# Patient Record
Sex: Male | Born: 1985 | Race: White | Hispanic: No | Marital: Single | Smoking: Never smoker
Health system: Southern US, Community
[De-identification: ages and names within clinical notes are randomized; demographics above are authoritative.]

## PROBLEM LIST (undated history)

## (undated) DIAGNOSIS — M1990 Unspecified osteoarthritis, unspecified site: Secondary | ICD-10-CM

## (undated) DIAGNOSIS — Z973 Presence of spectacles and contact lenses: Secondary | ICD-10-CM

## (undated) DIAGNOSIS — E878 Other disorders of electrolyte and fluid balance, not elsewhere classified: Secondary | ICD-10-CM

## (undated) DIAGNOSIS — R519 Headache, unspecified: Secondary | ICD-10-CM

## (undated) DIAGNOSIS — T4145XA Adverse effect of unspecified anesthetic, initial encounter: Secondary | ICD-10-CM

## (undated) DIAGNOSIS — K219 Gastro-esophageal reflux disease without esophagitis: Secondary | ICD-10-CM

## (undated) DIAGNOSIS — T8859XA Other complications of anesthesia, initial encounter: Secondary | ICD-10-CM

## (undated) DIAGNOSIS — R51 Headache: Secondary | ICD-10-CM

## (undated) DIAGNOSIS — E119 Type 2 diabetes mellitus without complications: Secondary | ICD-10-CM

## (undated) HISTORY — PX: TONSILLECTOMY: SUR1361

## (undated) HISTORY — PX: LIMB SALVAGE PROCEDURE: SHX1968

## (undated) HISTORY — PX: SHOULDER ARTHROSCOPY: SHX128

## (undated) HISTORY — PX: OTHER SURGICAL HISTORY: SHX169

## (undated) HISTORY — PX: WRIST ARTHROSCOPY: SUR100

## (undated) HISTORY — PX: KNEE ARTHROPLASTY: SHX992

---
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
## 2006-12-02 ENCOUNTER — Encounter: Admission: RE | Admit: 2006-12-02 | Discharge: 2006-12-02 | Payer: Self-pay | Admitting: Family Medicine

## 2006-12-09 ENCOUNTER — Encounter: Admission: RE | Admit: 2006-12-09 | Discharge: 2006-12-09 | Payer: Self-pay | Admitting: Emergency Medicine

## 2008-11-16 ENCOUNTER — Ambulatory Visit (HOSPITAL_BASED_OUTPATIENT_CLINIC_OR_DEPARTMENT_OTHER): Admission: RE | Admit: 2008-11-16 | Discharge: 2008-11-16 | Payer: Self-pay | Admitting: Orthopedic Surgery

## 2010-11-10 ENCOUNTER — Encounter: Payer: Self-pay | Admitting: Family Medicine

## 2010-11-17 ENCOUNTER — Ambulatory Visit (INDEPENDENT_AMBULATORY_CARE_PROVIDER_SITE_OTHER): Payer: Managed Care, Other (non HMO) | Admitting: Family Medicine

## 2010-11-17 ENCOUNTER — Encounter: Payer: Self-pay | Admitting: Family Medicine

## 2010-11-17 VITALS — BP 136/81 | Ht 73.0 in | Wt 311.0 lb

## 2010-11-17 DIAGNOSIS — M25579 Pain in unspecified ankle and joints of unspecified foot: Secondary | ICD-10-CM

## 2010-11-17 DIAGNOSIS — M2140 Flat foot [pes planus] (acquired), unspecified foot: Secondary | ICD-10-CM

## 2010-11-17 DIAGNOSIS — E669 Obesity, unspecified: Secondary | ICD-10-CM

## 2010-11-17 NOTE — Progress Notes (Signed)
  Subjective:    Patient ID: Rodney Clarke, male    DOB: Sep 02, 1985, 25 y.o.   MRN: [id number]  HPI  Bilateral R>L ankle pain for a month. 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 He does a lot of climbing on ladders  Which seems to make it worse.   Pain is posterior ankle near achilles--aching. ? Occasional swelling. No pop or pain in calf. Review of Systems Pertinent review of systems: negative for fever or unusual weight change.     Objective:   Physical Exam  generally WD overweight NAD EET: pes planus B. Loss of transverse arch B/ can only do three heel raises with both feet and narely one on single leg stance B. TTP  Area either side of the achilles B. Achilles is intact and without defect. Calf soft B. Normal calf squeeze test.  GAIT: out toeing. Strikes lateral heel/ Normal stride length.  ULTRASOUND: B achiiles intact. Some calcifications at insertion R>L. No increased vascularity on  Either side. No retrocalcaneal bursa noted. Calcaneus B without cortical defect. B Gastrocnemius and soleus m intact.      Assessment & Plan:  1.  B ankle pain--I think he became deconditioned to standing and climbing while off for his shoulder. We placed him in temp insoles w scaphoid pad B--if this improves his issues, he may rtc for custom molded orthotics. Will also start him on Heel raises twice daily (B 10-20) and( single stance 8-12 TID). I demonstrated these in clinic and he was given handout. RTC 3-4 w 2. Briefly discussed weight loss as a way to improve ankle pain as well

## 2010-11-18 ENCOUNTER — Encounter: Payer: Self-pay | Admitting: Family Medicine

## 2010-11-18 DIAGNOSIS — M25579 Pain in unspecified ankle and joints of unspecified foot: Secondary | ICD-10-CM | POA: Insufficient documentation

## 2010-11-18 DIAGNOSIS — E669 Obesity, unspecified: Secondary | ICD-10-CM | POA: Insufficient documentation

## 2010-11-18 DIAGNOSIS — M2140 Flat foot [pes planus] (acquired), unspecified foot: Secondary | ICD-10-CM | POA: Insufficient documentation

## 2010-12-19 ENCOUNTER — Ambulatory Visit (INDEPENDENT_AMBULATORY_CARE_PROVIDER_SITE_OTHER): Payer: Managed Care, Other (non HMO) | Admitting: Family Medicine

## 2010-12-19 ENCOUNTER — Encounter: Payer: Self-pay | Admitting: Family Medicine

## 2010-12-19 VITALS — BP 126/84 | HR 83

## 2010-12-19 DIAGNOSIS — M2170 Unequal limb length (acquired), unspecified site: Secondary | ICD-10-CM

## 2010-12-19 DIAGNOSIS — M25579 Pain in unspecified ankle and joints of unspecified foot: Secondary | ICD-10-CM

## 2010-12-19 DIAGNOSIS — M24873 Other specific joint derangements of unspecified ankle, not elsewhere classified: Secondary | ICD-10-CM

## 2010-12-19 DIAGNOSIS — M25373 Other instability, unspecified ankle: Secondary | ICD-10-CM | POA: Insufficient documentation

## 2010-12-19 MED ORDER — MELOXICAM 15 MG PO TABS: 15.0000 mg | ORAL_TABLET | Freq: Every day | ORAL | Status: DC

## 2010-12-19 NOTE — Progress Notes (Signed)
PT referral info faxed to [facility] in [facility] per patient request.

## 2010-12-19 NOTE — Assessment & Plan Note (Addendum)
Failed home exercise program. Formal physical therapy. Meloxicam daily. Unable to decrease time on his feet at work due to financial reasons, even with a physicians excuse. Custom orthotics as above. He'll come back to see [name] in 4 weeks, if no better we will try a guided corticosteroid injection into the posterior tibial tendon sheath. If we do a guided injection of the posterior tibial tendon sheath, I would likely put him in an air sport brace and minimize his weight-bearing as much as possible for a week.

## 2010-12-19 NOTE — Patient Instructions (Addendum)
Great to meet Rodney Clarke, Part of your foot soreness is due to a tendinitis. Rebuilt her custom orthotics today. Like to put you into formal physical therapy program. I want you to take meloxicam every day for 2 weeks and then as needed afterwards.  Come back to see Rodney Clarke in 4 weeks, if no better we will try a guided corticosteroid injection around the posterior tibialis tendon.  Rodney Clarke. [name], M.D. [facility] [address] [place], [place] [address] [phone]   Posterior Tibial Tendon Tendinitis  Tendonitis is a condition that is characterized by inflammation of a tendon or the lining (sheath) that surrounds it. The inflammation is usually caused by damage to the tendon, such as a tendon tear (strain). Sprains are classified into three categories. Grade 1 sprains cause pain, but the tendon is not lengthened. Grade 2 sprains include a lengthened ligament due to the ligament being stretched or partially ruptured. With grade 2 sprains there is still function, although the function may be diminished. Grade 3 sprains are characterized by a complete tear of the tendon or muscle, and function is usually impaired. Posterior tibialis tendonitis is tendonitis of the posterior tibial tendon, which attaches muscles of the lower leg to the foot. The posterior tibial tendon is located in the back of the ankle and helps the body straighten (plantarflex) and rotate inward (medially rotate) the ankle. SYMPTOMS    Pain, tenderness, swelling, warmth, and/or redness over the back of the inner ankle at the posterior tibial tendon or the inner part of the mid-foot.     Pain that worsens with plantarflexion or medial rotation of the ankle.     A crackling sound (crepitation) when the tendon is moved or touched.  CAUSES   Posterior tibial tendonitis occurs when damage to the posterior tibial tendon starts an inflammatory response. Common mechanisms of injury include:  Degenerative  (occurs with aging) processes that weaken the tendon and make it more susceptible to injury.     Stress placed on the tendon from an increase in the intensity, frequency, or duration of training.     Direct trauma to the ankle.     Returning to activity before a previous ankle injury is allowed to heal.  RISK INCREASES WITH:  Activities that involve repetitive and/or stressful plantarflexion (jumping, kicking, or running up/down hills).     Poor strength and flexibility.     Flat feet.     Previous injury to the foot, ankle, or leg.  PREVENTION    Warm up and stretch properly before activity.     Allow for adequate recovery between workouts.     Maintain physical fitness:     Strength, flexibility, and endurance.     Cardiovascular fitness.     Learn and use proper technique. When possible, have a coach correct improper technique.     Complete rehabilitation from a previous foot, ankle, or leg injury.     If you have flat feet, wear arch supports (orthotics).  PROGNOSIS   If treated properly, then the symptoms of tendonitis usually resolve within 6 weeks. This period may be shorter for injuries caused by direct trauma. RELATED COMPLICATIONS    Prolonged healing time, if improperly treated or re-injured.     Recurrent symptoms that result in a chronic problem.     Partial or complete tendon tear (rupture) requiring surgery.  TREATMENT   Treatment initially involves the use of ice and medication to help reduce pain and inflammation.  The use of strengthening and stretching exercises may help reduce pain with activity. These exercises may be performed at home or with referral to a therapist. Often times, your caregiver will recommend immobilizing the ankle to allow the tendon to heal. If you have flat feet, the you may be advised to wear orthotic arch supports. If symptoms persist for greater than 6 months despite non-surgical (conservative) treatment, then surgery may be  recommended. MEDICATION    If pain medication is necessary, then nonsteroidal anti-inflammatory medications, such as aspirin and ibuprofen, or other minor pain relievers, such as acetaminophen, are often recommended.     Do not take pain medication for 7 days before surgery.     Prescription pain relievers may be given if deemed necessary by your caregiver. Use only as directed and only as much as you need.     Corticosteroid injections may be given by your caregiver. These injections should be reserved for the most serious cases, because they may only be given a certain number of times.  HEAT AND COLD  Cold treatment (icing) relieves pain and reduces inflammation. Cold treatment should be applied for 10 to 15 minutes every 2 to 3 hours for inflammation and pain and immediately after any activity that aggravates your symptoms. Use ice packs or massage the area with a piece of ice (ice massage).     Heat treatment may be used prior to performing the stretching and strengthening activities prescribed by your caregiver, physical therapist, or athletic trainer. Use a heat pack or soak the injury in warm water.  SEEK MEDICAL CARE IF:  Treatment seems to offer no benefit, or the condition worsens.    Any medications produce adverse side effects.

## 2010-12-19 NOTE — Progress Notes (Signed)
  Subjective:    Patient ID: Rodney Clarke, male    DOB: 1985-07-15, 25 y.o.   MRN: [id number]  HPI Bilateral Ankle pain: Right side much worse than the left, present for approximately 2 months now, was seen here 3-4 weeks ago, had a sports insoles prescribed with scaphoid pads. Noted his currently approximately 15% better. 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 He was also given a home exercise program consisted of toe raises, which she's been doing religiously and does note that she significantly stronger now. Naproxen does help particularly at night. Localizes the pain to the medial ankle posterior and inferior to the malleolus. It is significantly worse with the first few steps in the morning, however he does not localize any pain over the plantar aspect of the calcaneus.  Social history: No alcohol tobacco or drugs. Works as an Retail banker. Review of Systems    no fevers, chills, night sweats, weight loss. Objective:   Physical Exam General: Well-developed, overweight Caucasian male in no acute distress. Skin: Warm and dry. Neuro: Alert and oriented x3 extraocular muscles intact. Respiratory: Not using accessory muscles. MSK: Ankles unremarkable to inspection. Tender to palpation over the posterior tibialis tendon bilaterally, right worse than left. He also has some tenderness over the medial predominantly on the right side. Range of motion full to dorsiflexion, plantar flexion, inversion, eversion. Negative squeeze test, negative Kleiger test, negative talar tilt, negative anterior drawer test. Medial and lateral ligaments stable. Significant pes planus. Neurovascularly intact distally. Good hindfoot varus with toe raises viewed from behind. Negative too many toes sign. Right leg: 95 cm. Left leg: 92 cm. Ambulates with an antalgic gait. There is significant popping noted with every step coming from his right ankle. This  is moderately painful.  Patient was fitted for a : standard, cushioned, semi-rigid orthotic. The orthotic was heated and afterward the patient stood on the orthotic blank positioned on the orthotic stand. The patient was positioned in subtalar neutral position and 10 degrees of ankle dorsiflexion in a weight bearing stance. After completion of molding, a stable base was applied to the orthotic blank. The blank was ground to a stable position for weight bearing. Size: 11 Base: Blue EVA Posting: None Additional orthotic padding: We placed an additional blue EVA under the left orthotic to partially correct his leg length discrepancy. He was also given a felt lateral wedge on his right orthotic to correct oversupination. This improved a great deal of his popping and pain.  MSK ultrasound: Right ankle: Achilles tendon imaged in long, transverse, and logic views and unremarkable. There was a small nodule, however this was not tender to palpation and unlikely clinically significant. No retrocalcaneal bursa seen. Posterior tibial tendon noted to have significant fluid in the sheath. I did compared this to the posterior tibial tendon on the left side, and the left side did not display any significant fluid in the sheath. No increased Doppler flow seen. Images saved.    Assessment & Plan:

## 2010-12-23 ENCOUNTER — Ambulatory Visit: Payer: Managed Care, Other (non HMO) | Admitting: Physical Therapy

## 2011-01-16 ENCOUNTER — Ambulatory Visit (INDEPENDENT_AMBULATORY_CARE_PROVIDER_SITE_OTHER): Payer: Managed Care, Other (non HMO) | Admitting: Family Medicine

## 2011-01-16 ENCOUNTER — Encounter: Payer: Self-pay | Admitting: Family Medicine

## 2011-01-16 VITALS — BP 122/83 | HR 76

## 2011-01-16 DIAGNOSIS — M25579 Pain in unspecified ankle and joints of unspecified foot: Secondary | ICD-10-CM

## 2011-01-16 NOTE — Patient Instructions (Addendum)
We modified the orthotics with a small lateral post to take pressure off the outside of your foot.  You had developed a peroneal tendonitis due to overcorrection. Come back to see [name] in 1-2 weeks if no better. Ihor Austin. [name], M.D.

## 2011-01-16 NOTE — Progress Notes (Signed)
  Subjective:    Patient ID: Rodney Clarke, male    DOB: [birth year], 25 y.o.   MRN: [id number]  HPI  Bilateral Ankle pain: Here for followup of pain, had orthotics made as last visit. 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 Continues to do home exercise program. Switched to meloxicam at the last visit which is only of moderate benefit. Initially had pain predominantly over the posterior tibialis tendons, after orthotics he notes pain more predominantly over the lateral ankle. Still gets occasional clicking and catching.  Social history: No alcohol tobacco or drugs. Works as an Retail banker. Review of Systems    no fevers, chills, night sweats, weight loss. Objective:   Physical Exam  General: Well-developed, overweight Caucasian male in no acute distress. Skin: Warm and dry. Neuro: Alert and oriented x3 extraocular muscles intact. Respiratory: Not using accessory muscles. MSK: Ankles unremarkable to inspection. Tender to palpation over the peroneal tendons bilaterally, pain with resisted eversion. Range of motion full to dorsiflexion, plantar flexion, inversion, eversion. Negative squeeze test, negative Kleiger test, negative talar tilt, negative anterior drawer test. Medial and lateral ligaments stable. Significant pes planus. Neurovascularly intact distally. Good hindfoot varus with toe raises viewed from behind. Negative too many toes sign. Right leg: 95 cm. Left leg: 92 cm. No longer ambulates with an antalgic gait, popping he had before is significantly less.  We placed a lateral posting on both orthotics. He ambulated and these and they were comfortable.    Assessment & Plan:   1. bilateral foot pain: Overall improved, however I think the overcorrected his pes planus and created a mild peroneal tendinopathy. This was corrected with a slight lateral posting on both sides. We'll see him back in a period of one to 2  weeks, if still no better we will MRI his ankle.  As he did have significant halo sign at his first visit over the posterior tibialis tendon, and ultrasound guided tendon sheath injection is still an option.

## 2011-01-25 ENCOUNTER — Emergency Department (INDEPENDENT_AMBULATORY_CARE_PROVIDER_SITE_OTHER): Payer: Worker's Compensation

## 2011-01-25 ENCOUNTER — Emergency Department (HOSPITAL_BASED_OUTPATIENT_CLINIC_OR_DEPARTMENT_OTHER)
Admission: EM | Admit: 2011-01-25 | Discharge: 2011-01-25 | Disposition: A | Discharge status: Home / Self Care | Payer: Worker's Compensation | Attending: Emergency Medicine | Admitting: Emergency Medicine

## 2011-01-25 ENCOUNTER — Encounter (HOSPITAL_BASED_OUTPATIENT_CLINIC_OR_DEPARTMENT_OTHER): Payer: Self-pay | Admitting: *Deleted

## 2011-01-25 DIAGNOSIS — S60229A Contusion of unspecified hand, initial encounter: Secondary | ICD-10-CM

## 2011-01-25 DIAGNOSIS — IMO0002 Reserved for concepts with insufficient information to code with codable children: Secondary | ICD-10-CM | POA: Insufficient documentation

## 2011-01-25 DIAGNOSIS — X58XXXA Exposure to other specified factors, initial encounter: Secondary | ICD-10-CM

## 2011-01-25 DIAGNOSIS — Y9289 Other specified places as the place of occurrence of the external cause: Secondary | ICD-10-CM | POA: Insufficient documentation

## 2011-01-25 DIAGNOSIS — M79609 Pain in unspecified limb: Secondary | ICD-10-CM

## 2011-01-25 HISTORY — DX: Other disorders of electrolyte and fluid balance, not elsewhere classified: E87.8

## 2011-01-25 MED ORDER — HYDROCODONE-ACETAMINOPHEN 5-500 MG PO TABS: 1.0000 | ORAL_TABLET | Freq: Four times a day (QID) | ORAL | Status: AC | PRN

## 2011-01-25 NOTE — ED Provider Notes (Signed)
History     CSN: [id number] Arrival date & time: 01/25/2011  7:22 PM   First MD Initiated Contact with Patient 01/25/11 1923      Chief Complaint  Patient presents with  . 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 male presenting with hand injury. The history is provided by the patient. No language interpreter was used.  Hand Injury  The incident occurred 1 to 2 hours ago. The incident occurred at work. The injury mechanism was a direct blow. The pain is present in the right hand. The quality of the pain is described as throbbing. The pain is moderate. The pain has been constant since the incident. Pertinent negatives include no fever. He reports no foreign bodies present. The symptoms are aggravated by movement and palpation. He has tried nothing for the symptoms.    Past Medical History  Diagnosis Date  . Hypertension   . Hyperchloremia     Past Surgical History  Procedure Date  . Knee arthroplasty   . Shoulder arthroscopy   . Limb salvage procedure     History reviewed. No pertinent family history.  History  Substance Use Topics  . Smoking status: Never Smoker   . Smokeless tobacco: Never Used  . Alcohol Use: No      Review of Systems  Constitutional: Negative for fever.  All other systems reviewed and are negative.    Allergies  Phenergan  Home Medications   Current Outpatient Rx  Name Route Sig Dispense Refill  . IBUPROFEN 200 MG PO TABS Oral Take 800 mg by mouth every 6 (six) hours as needed. For pain     . MELOXICAM 15 MG PO TABS Oral Take 1 tablet (15 mg total) by mouth daily. 30 tablet 3    BP 139/83  Pulse 92  Temp 97.6 F (36.4 C)  Resp 16  Wt 311 lb (141.069 kg)   SpO2 97%  Physical Exam  Nursing note and vitals reviewed. Constitutional: He appears well-developed and well-nourished.  Cardiovascular: Normal rate and regular rhythm.   Pulmonary/Chest: Effort normal and breath sounds normal.  Musculoskeletal: Normal range of motion.       No gross deformity noted to the right hand:pt has full rom:pt tender along the fifth metacarpal  Neurological: He is alert.  Skin: Skin is dry.    ED Course  Procedures (including critical care time)  Labs Reviewed - No data to display Dg Hand Complete Right  01/25/2011  *RADIOLOGY REPORT*  Clinical Data: Right hand pain.  Fourth and fifth finger pain.  RIGHT HAND - COMPLETE 3+ VIEW  Comparison: None.  Findings: Anatomic alignment.  No fracture.  STT joint osteoarthritis is present.  Soft tissues appear within normal limits.  IMPRESSION: No acute abnormality.  Original Report Authenticated By: Andreas Newport, M.D.     1. Hand contusion       MDM  Pt ace wrapped for comfort:no acute bony abnormality noted       Teressa Lower, NP 01/25/11 2034  Medical screening examination/treatment/procedure(s) were performed by non-physician practitioner and as supervising physician I was immediately available for consultation/collaboration.  Sunnie Nielsen, MD 01/26/11 [phone]

## 2011-01-25 NOTE — ED Notes (Signed)
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

## 2011-01-28 ENCOUNTER — Ambulatory Visit (INDEPENDENT_AMBULATORY_CARE_PROVIDER_SITE_OTHER): Payer: Managed Care, Other (non HMO) | Admitting: Sports Medicine

## 2011-01-28 VITALS — BP 128/79

## 2011-01-28 DIAGNOSIS — M25579 Pain in unspecified ankle and joints of unspecified foot: Secondary | ICD-10-CM

## 2011-01-28 MED ORDER — AMITRIPTYLINE HCL 25 MG PO TABS: 25.0000 mg | ORAL_TABLET | Freq: Every day | ORAL | Status: DC

## 2011-01-28 NOTE — Patient Instructions (Signed)
Amitriptyline at bedtime. Modified orthotic. MRI ankle. Come back to see me after your MRI to go over results. Ihor Austin. [name], M.D.

## 2011-01-28 NOTE — Progress Notes (Signed)
  Subjective:    Patient ID: Rodney Clarke, male    DOB: 04-Feb-1986, 25 y.o.   MRN: [id number]  HPI The patient comes in for followup of bilateral foot pain right worse than left that has been present for 6 months. We initially diagnosed him with pes planus, posterior tibialis tendinitis which was confirmed by seeing a halo sign on the ultrasound. Custom orthotics were made which did help a little bit, however he did develop some peroneal tendinitis bilaterally. At that point we had considered that maybe we performed overcorrection of his pes planus and placed a lateral post. He did not notice a whole lot of difference, however returns today with increasing pain over his posterior tibialis tendon. He also gets significant popping that is audible over the lateral aspect of his right ankle.  Review of Systems    no fevers, chills, night sweats, weight loss. Objective:   Physical Exam General: Well-developed, overweight Caucasian male in no acute distress. Skin: Warm and dry. Neuro: Alert and oriented x3 extraocular muscles intact. Respiratory: Not using accessory muscles. MSK: Ankles unremarkable to inspection. Tender to palpation over the peroneal tendons bilaterally, pain with resisted eversion. Tenderness to palpation over the posterior tibialis tendons. Range of motion full to dorsiflexion, plantar flexion, inversion, eversion. Negative squeeze test, negative Kleiger test, negative talar tilt, negative anterior drawer test. Medial and lateral ligaments stable. Significant pes planus. Neurovascularly intact distally. Good hindfoot varus with toe raises viewed from behind. Negative too many toes sign.    Assessment & Plan:   1. ankle pain: Persistent pain despite above corrections, I do suspect that this is predominantly a posterior tibialis tendinitis. We removed the post from the anterior aspect of his orthotics. We'll also go ahead and get an MRI. If we see no tears, and just tendinopathy  of the posterior tibialis tendon we can do an ultrasound guided injection, we would likely place him in a boot for a week after the shot. We'll see him back after the MRI is done to go over results.

## 2011-01-30 ENCOUNTER — Ambulatory Visit
Admission: RE | Admit: 2011-01-30 | Discharge: 2011-01-30 | Disposition: A | Discharge status: Home / Self Care | Payer: Managed Care, Other (non HMO) | Source: Ambulatory Visit | Attending: Sports Medicine | Admitting: Sports Medicine

## 2011-01-30 ENCOUNTER — Other Ambulatory Visit: Payer: Self-pay | Admitting: Sports Medicine

## 2011-01-30 DIAGNOSIS — M25579 Pain in unspecified ankle and joints of unspecified foot: Secondary | ICD-10-CM

## 2011-01-30 DIAGNOSIS — Z139 Encounter for screening, unspecified: Secondary | ICD-10-CM

## 2011-03-09 ENCOUNTER — Other Ambulatory Visit: Payer: Self-pay | Admitting: Orthopedic Surgery

## 2011-03-09 DIAGNOSIS — R200 Anesthesia of skin: Secondary | ICD-10-CM

## 2011-03-18 ENCOUNTER — Ambulatory Visit
Admission: RE | Admit: 2011-03-18 | Discharge: 2011-03-18 | Disposition: A | Discharge status: Home / Self Care | Payer: Worker's Compensation | Source: Ambulatory Visit | Attending: Orthopedic Surgery | Admitting: Orthopedic Surgery

## 2011-03-18 ENCOUNTER — Ambulatory Visit
Admission: RE | Admit: 2011-03-18 | Discharge: 2011-03-18 | Disposition: A | Discharge status: Home / Self Care | Payer: Self-pay | Source: Ambulatory Visit | Attending: Orthopedic Surgery | Admitting: Orthopedic Surgery

## 2011-03-18 DIAGNOSIS — R200 Anesthesia of skin: Secondary | ICD-10-CM

## 2011-03-18 MED ORDER — IOHEXOL 180 MG/ML  SOLN
4.0000 mL | Freq: Once | INTRAMUSCULAR | Status: AC | PRN
  Administered 2011-03-18: 4 mL via INTRA_ARTICULAR

## 2012-04-18 IMAGING — RF DG FLUORO GUIDE NDL PLC/BX
1 series · 1 of 1 positions shown · IV contrast (omnipaque)
Comparison: None.

CLINICAL DATA: Right wrist pain

ARTHROGRAM OF THE RIGHT WRIST
TECHNIQUE: The dorsum of the right wrist was prepped and draped in
a sterile fashion.  1% lidocaine was utilized for local anesthesia.
Under fluoroscopic guidance, a 25 gauge needle was inserted into
the radiocarpal joint.  Contrast was injected.
Fluoroscopy Time: 27 seconds.
Contrast: 10 ml Omnipaque 180 and 0.05 ml Multihance.

[Series 1: (hospital) · 1 of 1 slices shown]
[im 1/1]
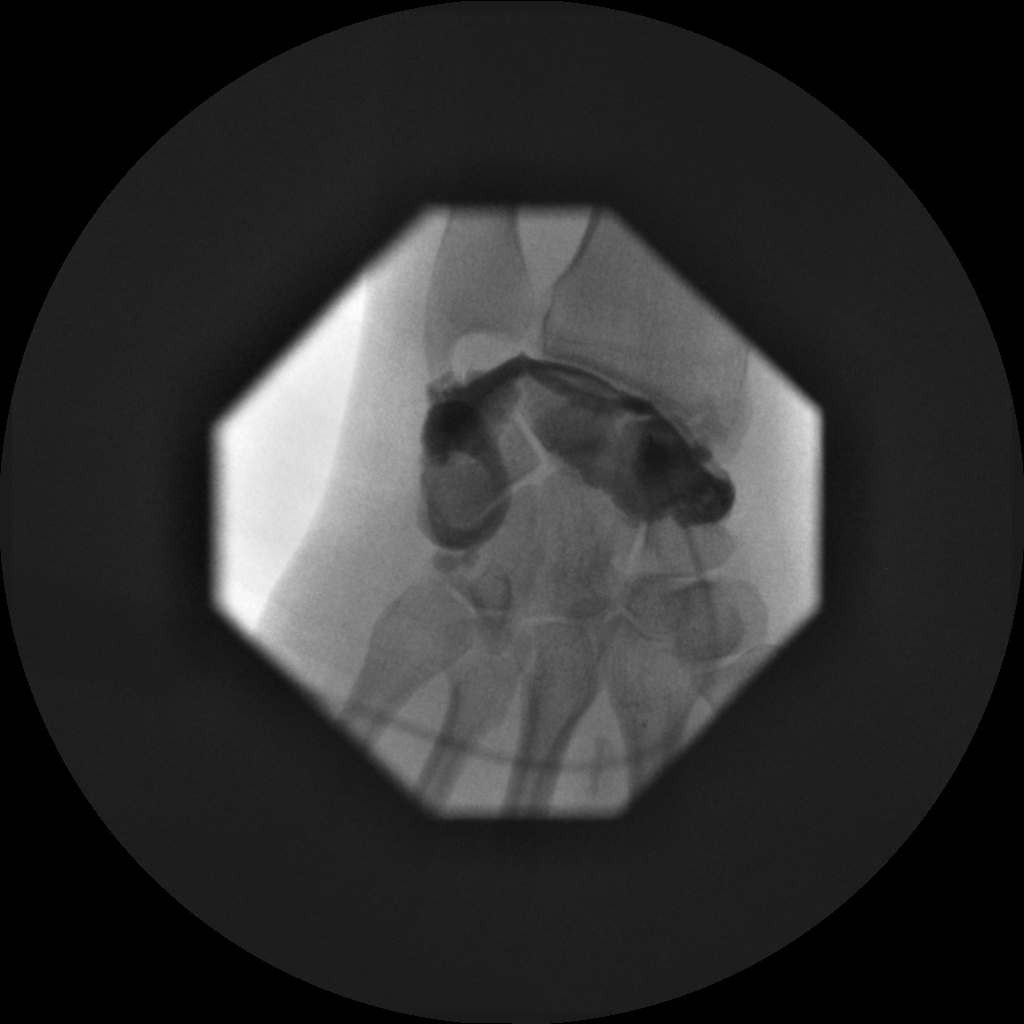

[1 of 1 positions shown; findings below may reference images not displayed]

FINDINGS: Imaging documents needle placement in the radiocarpal
joint.
IMPRESSION: Wrist arthrogram for MRI.

## 2013-07-20 ENCOUNTER — Emergency Department (HOSPITAL_BASED_OUTPATIENT_CLINIC_OR_DEPARTMENT_OTHER)
Admission: EM | Admit: 2013-07-20 | Discharge: 2013-07-20 | Disposition: A | Discharge status: Home / Self Care | Payer: Worker's Compensation | Attending: Emergency Medicine | Admitting: Emergency Medicine

## 2013-07-20 ENCOUNTER — Encounter (HOSPITAL_BASED_OUTPATIENT_CLINIC_OR_DEPARTMENT_OTHER): Payer: Self-pay | Admitting: Emergency Medicine

## 2013-07-20 DIAGNOSIS — Y9289 Other specified places as the place of occurrence of the external cause: Secondary | ICD-10-CM | POA: Insufficient documentation

## 2013-07-20 DIAGNOSIS — Z791 Long term (current) use of non-steroidal anti-inflammatories (NSAID): Secondary | ICD-10-CM | POA: Insufficient documentation

## 2013-07-20 DIAGNOSIS — IMO0002 Reserved for concepts with insufficient information to code with codable children: Secondary | ICD-10-CM | POA: Insufficient documentation

## 2013-07-20 DIAGNOSIS — Y9389 Activity, other specified: Secondary | ICD-10-CM | POA: Insufficient documentation

## 2013-07-20 DIAGNOSIS — E878 Other disorders of electrolyte and fluid balance, not elsewhere classified: Secondary | ICD-10-CM | POA: Insufficient documentation

## 2013-07-20 DIAGNOSIS — S199XXA Unspecified injury of neck, initial encounter: Secondary | ICD-10-CM

## 2013-07-20 DIAGNOSIS — Y990 Civilian activity done for income or pay: Secondary | ICD-10-CM | POA: Insufficient documentation

## 2013-07-20 DIAGNOSIS — E669 Obesity, unspecified: Secondary | ICD-10-CM | POA: Insufficient documentation

## 2013-07-20 DIAGNOSIS — S060XAA Concussion with loss of consciousness status unknown, initial encounter: Secondary | ICD-10-CM

## 2013-07-20 DIAGNOSIS — S0990XA Unspecified injury of head, initial encounter: Secondary | ICD-10-CM

## 2013-07-20 DIAGNOSIS — S0993XA Unspecified injury of face, initial encounter: Secondary | ICD-10-CM | POA: Insufficient documentation

## 2013-07-20 DIAGNOSIS — E785 Hyperlipidemia, unspecified: Secondary | ICD-10-CM | POA: Insufficient documentation

## 2013-07-20 DIAGNOSIS — I10 Essential (primary) hypertension: Secondary | ICD-10-CM | POA: Insufficient documentation

## 2013-07-20 DIAGNOSIS — S060X9A Concussion with loss of consciousness of unspecified duration, initial encounter: Secondary | ICD-10-CM | POA: Insufficient documentation

## 2013-07-20 MED ORDER — IBUPROFEN 800 MG PO TABS: 800.0000 mg | ORAL_TABLET | Freq: Three times a day (TID) | ORAL | Status: DC | PRN

## 2013-07-20 MED ORDER — KETOROLAC TROMETHAMINE 30 MG/ML IJ SOLN
30.0000 mg | Freq: Once | INTRAMUSCULAR | Status: AC
  Administered 2013-07-20: 30 mg via INTRAVENOUS
  Filled 2013-07-20: qty 1

## 2013-07-20 MED ORDER — ONDANSETRON HCL 4 MG PO TABS: 4.0000 mg | ORAL_TABLET | Freq: Four times a day (QID) | ORAL | Status: DC

## 2013-07-20 MED ORDER — ONDANSETRON HCL 4 MG/2ML IJ SOLN
4.0000 mg | Freq: Once | INTRAMUSCULAR | Status: AC
  Administered 2013-07-20: 4 mg via INTRAVENOUS
  Filled 2013-07-20: qty 2

## 2013-07-20 MED ORDER — MECLIZINE HCL 50 MG PO TABS: 50.0000 mg | ORAL_TABLET | Freq: Three times a day (TID) | ORAL | Status: DC | PRN

## 2013-07-20 MED ORDER — SODIUM CHLORIDE 0.9 % IV BOLUS (SEPSIS)
1000.0000 mL | Freq: Once | INTRAVENOUS | Status: AC
  Administered 2013-07-20: 1000 mL via INTRAVENOUS

## 2013-07-20 NOTE — ED Provider Notes (Signed)
TIME SEEN: 9:21 PM  CHIEF COMPLAINT: Head injury  HPI: Patient is a 28 y.o. M with history of hypertension, hyperlipidemia, obesity who presents emergency Department head injury that occurred earlier work today. He states that he works for an [employer] company and he was driving down the road when he had a bump in his head on the top of the metal truck. No loss of consciousness but he has had diffuse headache, dizziness and nausea since. He is also having some left-sided neck pain but no midline spinal tenderness. No numbness or focal weakness. No bowel or bladder incontinence. No vomiting. No fever.  ROS: See HPI Constitutional: no fever  Eyes: no drainage  ENT: no runny nose   Cardiovascular:  no chest pain  Resp: no SOB  GI: no vomiting GU: no dysuria Integumentary: no rash  Allergy: no hives  Musculoskeletal: no leg swelling  Neurological: no slurred speech ROS otherwise negative  PAST MEDICAL HISTORY/PAST SURGICAL HISTORY:  Past Medical History  Diagnosis Date  . Hypertension   . Hyperchloremia     MEDICATIONS:  Prior to Admission medications   Medication Sig Start Date End Date Taking? Authorizing Provider  amitriptyline (ELAVIL) 25 MG tablet Take 1 tablet (25 mg total) by mouth at bedtime. 01/28/11 01/28/12  Monica Bectonhomas J Thekkekandam, MD  ibuprofen (ADVIL,MOTRIN) 200 MG tablet Take 800 mg by mouth every 6 (six) hours as needed. For pain     Historical Provider, MD  meloxicam (MOBIC) 15 MG tablet Take 1 tablet (15 mg total) by mouth daily. 12/19/10 12/19/11  Monica Bectonhomas J Thekkekandam, MD    ALLERGIES:  Allergies  Allergen Reactions  . Promethazine Hcl     Hallucinations     SOCIAL HISTORY:  History  Substance Use Topics  . Smoking status: Never Smoker   . Smokeless tobacco: Never Used  . Alcohol Use: No    FAMILY HISTORY: History reviewed. No pertinent family history.  EXAM: BP 159/86  Pulse 84  Temp(Src) 98 F (36.7 C) (Oral)  Resp 18  Ht 6\' 1"  (1.854 m)   Wt 307 lb (139.254 kg)  BMI 40.51 kg/m2  SpO2 99% CONSTITUTIONAL: Alert and oriented and responds appropriately to questions. Well-appearing; well-nourished HEAD: Normocephalic EYES: Conjunctivae clear, PERRL ENT: normal nose; no rhinorrhea; moist mucous membranes; pharynx without lesions noted NECK: Supple, no meningismus, no LAD; no midline spinal tenderness or step-off or deformity  CARD: RRR; S1 and S2 appreciated; no murmurs, no clicks, no rubs, no gallops RESP: Normal chest excursion without splinting or tachypnea; breath sounds clear and equal bilaterally; no wheezes, no rhonchi, no rales,  ABD/GI: Normal bowel sounds; non-distended; soft, non-tender, no rebound, no guarding BACK:  The back appears normal and is non-tender to palpation, there is no CVA tenderness, no midline spinal tenderness or step-off or deformity EXT: Normal ROM in all joints; non-tender to palpation; no edema; normal capillary refill; no cyanosis    SKIN: Normal color for age and race; warm NEURO: Moves all extremities equally strength 5/5 in all 4 extremity, sensation to light touch intact diffusely, cranial nerves II through XII intact, normal gait PSYCH: The patient's mood and manner are appropriate. Grooming and personal hygiene are appropriate.  MEDICAL DECISION MAKING: Patient here with postconcussive symptoms. He is hemodynamically stable, neurologically intact. Have discussed with patient and mother why do not feel he needs a head CT at this time as this will likely be negative. They agree with this plan. Will treat symptomatically with IV fluids, Toradol  and Zofran.  ED PROGRESS: Patient reports feeling much better with Toradol, Zofran and IV fluids. He is ready for discharge home. We'll discharge with prescription for Zofran, ibuprofen and meclizine as needed. Have discussed supportive care instructions, allowing rest for the next several days. Have discussed return precautions. Patient and mother verbalizes  understanding and are comfortable with plan.     Layla MawKristen N Flavius Repsher, DO 07/20/13 2307

## 2013-07-20 NOTE — ED Notes (Signed)
Patient states that he went over a hard bump earlier today at work and hit his head into the top of the truck. He works for an [employer].  This took place at 330 thsi afternoon and since he has had some dizziness and nausea. The patient also reports that he is having HA and neck pain to the left side.

## 2013-07-20 NOTE — Discharge Instructions (Signed)
Concussion, Adult °A concussion, or closed-head injury, is a brain injury caused by a direct blow to the head or by a quick and sudden movement (jolt) of the head or neck. Concussions are usually not life-threatening. Even so, the effects of a concussion can be serious. If you have had a concussion before, you are more likely to experience concussion-like symptoms after a direct blow to the head.  °CAUSES  °· Direct blow to the head, such as from running into another player during a soccer game, being hit in a fight, or hitting your head on a hard surface. °· A jolt of the head or neck that causes the brain to move back and forth inside the skull, such as in a car crash. °SIGNS AND SYMPTOMS  °The signs of a concussion can be hard to notice. Early on, they may be missed by you, family members, and health care providers. You may look fine but act or feel differently. °Symptoms are usually temporary, but they may last for days, weeks, or even longer. Some symptoms may appear right away while others may not show up for hours or days. Every head injury is different. Symptoms include:  °· Mild to moderate headaches that will not go away. °· A feeling of pressure inside your head.  °· Having more trouble than usual:   °· Learning or remembering things you have heard. °· Answering questions.  °· Paying attention or concentrating.   °· Organizing daily tasks.   °· Making decisions and solving problems.   °· Slowness in thinking, acting or reacting, speaking, or reading.   °· Getting lost or being easily confused.   °· Feeling tired all the time or lacking energy (fatigued).   °· Feeling drowsy.   °· Sleep disturbances.   °· Sleeping more than usual.   °· Sleeping less than usual.   °· Trouble falling asleep.   °· Trouble sleeping (insomnia).   °· Loss of balance or feeling lightheaded or dizzy.   °· Nausea or vomiting.   °· Numbness or tingling.   °· Increased sensitivity to:   °· Sounds.   °· Lights.   °· Distractions.    °· Vision problems or eyes that tire easily.   °· Diminished sense of taste or smell.   °· Ringing in the ears.   °· Mood changes such as feeling sad or anxious.   °· Becoming easily irritated or angry for little or no reason.   °· Lack of motivation. °· Seeing or hearing things other people do not see or hear (hallucinations). °DIAGNOSIS  °Your health care provider can usually diagnose a concussion based on a description of your injury and symptoms. He or she will ask whether you passed out (lost consciousness) and whether you are having trouble remembering events that happened right before and during your injury.  °Your evaluation might include:  °· A brain scan to look for signs of injury to the brain. Even if the test shows no injury, you may still have a concussion.   °· Blood tests to be sure other problems are not present. °TREATMENT  °· Concussions are usually treated in an emergency department, in urgent care, or at a clinic. You may need to stay in the hospital overnight for further treatment.   °· Tell your health care provider if you are taking any medicines, including prescription medicines, over-the-counter medicines, and natural remedies. Some medicines, such as blood thinners (anticoagulants) and aspirin, may increase the chance of complications. Also tell your health care provider whether you have had alcohol or are taking illegal drugs. This information may affect treatment. °· Your health care provider will send you   home with important instructions to follow. °· How fast you will recover from a concussion depends on many factors. These factors include how severe your concussion is, what part of your brain was injured, your age, and how healthy you were before the concussion. °· Most people with mild injuries recover fully. Recovery can take time. In general, recovery is slower in older persons. Also, persons who have had a concussion in the past or have other medical problems may find that it  takes longer to recover from their current injury. °HOME CARE INSTRUCTIONS  °General Instructions °· Carefully follow the directions your health care provider gave you. °· Only take over-the-counter or prescription medicines for pain, discomfort, or fever as directed by your health care provider. °· Take only those medicines that your health care provider has approved. °· Do not drink alcohol until your health care provider says you are well enough to do so. Alcohol and certain other drugs may slow your recovery and can put you at risk of further injury. °· If it is harder than usual to remember things, write them down. °· If you are easily distracted, try to do one thing at a time. For example, do not try to watch TV while fixing dinner. °· Talk with family members or close friends when making important decisions. °· Keep all follow-up appointments. Repeated evaluation of your symptoms is recommended for your recovery. °· Watch your symptoms and tell others to do the same. Complications sometimes occur after a concussion. Older adults with a brain injury may have a higher risk of serious complications such as of a blood clot on the brain. °· Tell your teachers, school nurse, school counselor, coach, athletic trainer, or work manager about your injury, symptoms, and restrictions. Tell them about what you can or cannot do. They should watch for:   °· Increased problems with attention or concentration.   °· Increased difficulty remembering or learning new information.   °· Increased time needed to complete tasks or assignments.   °· Increased irritability or decreased ability to cope with stress.   °· Increased symptoms.   °· Rest. Rest helps the brain to heal. Make sure you: °· Get plenty of sleep at night. Avoid staying up late at night. °· Keep the same bedtime hours on weekends and weekdays. °· Rest during the day. Take daytime naps or rest breaks when you feel tired. °· Limit activities that require a lot of  thought or concentration. These includes   °· Doing homework or job-related work.   °· Watching TV.   °· Working on the computer. °· Avoid any situation where there is potential for another head injury (football, hockey, soccer, basketball, martial arts, downhill snow sports and horseback riding). Your condition will get worse every time you experience a concussion. You should avoid these activities until you are evaluated by the appropriate follow-up caregivers. °Returning To Your Regular Activities °You will need to return to your normal activities slowly, not all at once. You must give your body and brain enough time for recovery. °· Do not return to sports or other athletic activities until your health care provider tells you it is safe to do so. °· Ask your health care provider when you can drive, ride a bicycle, or operate heavy machinery. Your ability to react may be slower after a brain injury. Never do these activities if you are dizzy. °· Ask your health care provider about when you can return to work or school. °Preventing Another Concussion °It is very important to avoid another   brain injury, especially before you have recovered. In rare cases, another injury can lead to permanent brain damage, brain swelling, or death. The risk of this is greatest during the first 7 10 days after a head injury. Avoid injuries by:  °· Wearing a seat belt when riding in a car.   °· Drinking alcohol only in moderation.   °· Wearing a helmet when biking, skiing, skateboarding, skating, or doing similar activities. °· Avoiding activities that could lead to a second concussion, such as contact or recreational sports, until your health care provider says it is OK. °· Taking safety measures in your home.   °· Remove clutter and tripping hazards from floors and stairways.   °· Use grab bars in bathrooms and handrails by stairs.   °· Place non-slip mats on floors and in bathtubs.   °· Improve lighting in dim areas. °SEEK MEDICAL  CARE IF:  °· You have increased problems paying attention or concentrating.   °· You have increased difficulty remembering or learning new information.   °· You need more time to complete tasks or assignments than before.   °· You have increased irritability or decreased ability to cope with stress. °· You have more symptoms than before. °Seek medical care if you have any of the following symptoms for more than 2 weeks after your injury:  °· Lasting (chronic) headaches.   °· Dizziness or balance problems.   °· Nausea. °· Vision problems.   °· Increased sensitivity to noise or light.   °· Depression or mood swings.   °· Anxiety or irritability.   °· Memory problems.   °· Difficulty concentrating or paying attention.   °· Sleep problems.   °· Feeling tired all the time. °SEEK IMMEDIATE MEDICAL CARE IF:  °· You have severe or worsening headaches. These may be a sign of a blood clot in the brain. °· You have weakness (even if only in one hand, leg, or part of the face). °· You have numbness. °· You have decreased coordination.   °· You vomit repeatedly.  °· You have increased sleepiness. °· One pupil is larger than the other.   °· You have convulsions.   °· You have slurred speech.   °· You have increased confusion. This may be a sign of a blood clot in the brain. °· You have increased restlessness, agitation, or irritability.   °· You are unable to recognize people or places.   °· You have neck pain.   °· It is difficult to wake you up.   °· You have unusual behavior changes.   °· You lose consciousness. °MAKE SURE YOU:  °· Understand these instructions. °· Will watch your condition. °· Will get help right away if you are not doing well or get worse. °Document Released: 05/09/2003 Document Revised: 10/19/2012 Document Reviewed: 09/08/2012 °ExitCare® Patient Information ©2014 [employer]. ° °Head Injury, Adult °You have received a head injury. It does not appear serious at this time. Headaches and vomiting are common  following head injury. It should be easy to awaken from sleeping. Sometimes it is necessary for you to stay in the emergency department for a while for observation. Sometimes admission to the hospital may be needed. After injuries such as yours, most problems occur within the first 24 hours, but side effects may occur up to 7 10 days after the injury. It is important for you to carefully monitor your condition and contact your health care provider or seek immediate medical care if there is a change in your condition. °WHAT ARE THE TYPES OF HEAD INJURIES? °Head injuries can be as minor as a bump. Some head injuries can   be more severe. More severe head injuries include: °· A jarring injury to the brain (concussion). °· A bruise of the brain (contusion). This mean there is bleeding in the brain that can cause swelling. °· A cracked skull (skull fracture). °· Bleeding in the brain that collects, clots, and forms a bump (hematoma). °WHAT CAUSES A HEAD INJURY? °A serious head injury is most likely to happen to someone who is in a car wreck and is not wearing a seat belt. Other causes of major head injuries include bicycle or motorcycle accidents, sports injuries, and falls. °HOW ARE HEAD INJURIES DIAGNOSED? °A complete history of the event leading to the injury and your current symptoms will be helpful in diagnosing head injuries. Many times, pictures of the brain, such as CT or MRI are needed to see the extent of the injury. Often, an overnight hospital stay is necessary for observation.  °WHEN SHOULD I SEEK IMMEDIATE MEDICAL CARE?  °You should get help right away if: °· You have confusion or drowsiness. °· You feel sick to your stomach (nauseous) or have continued, forceful vomiting. °· You have dizziness or unsteadiness that is getting worse. °· You have severe, continued headaches not relieved by medicine. Only take over-the-counter or prescription medicines for pain, fever, or discomfort as directed by your health  care provider. °· You do not have normal function of the arms or legs or are unable to walk. °· You notice changes in the black spots in the center of the colored part of your eye (pupil). °· You have a clear or bloody fluid coming from your nose or ears. °· You have a loss of vision. °During the next 24 hours after the injury, you must stay with someone who can watch you for the warning signs. This person should contact local emergency services (911 in the [place] if you have seizures, you become unconscious, or you are unable to wake up. °HOW CAN I PREVENT A HEAD INJURY IN THE FUTURE? °The most important factor for preventing major head injuries is avoiding motor vehicle accidents.  To minimize the potential for damage to your head, it is crucial to wear seat belts while riding in motor vehicles. Wearing helmets while bike riding and playing collision sports (like football) is also helpful. Also, avoiding dangerous activities around the house will further help reduce your risk of head injury.  °WHEN CAN I RETURN TO NORMAL ACTIVITIES AND ATHLETICS? °You should be reevaluated by your health care provider before returning to these activities. If you have any of the following symptoms, you should not return to activities or contact sports until 1 week after the symptoms have stopped: °· Persistent headache. °· Dizziness or vertigo. °· Poor attention and concentration. °· Confusion. °· Memory problems. °· Nausea or vomiting. °· Fatigue or tire easily. °· Irritability. °· Intolerant of bright lights or loud noises. °· Anxiety or depression. °· Disturbed sleep. °MAKE SURE YOU:  °· Understand these instructions. °· Will watch your condition. °· Will get help right away if you are not doing well or get worse. °Document Released: 02/16/2005 Document Revised: 12/07/2012 Document Reviewed: 10/24/2012 °ExitCare® Patient Information ©2014 [employer]. ° °

## 2014-08-01 ENCOUNTER — Encounter (HOSPITAL_COMMUNITY)
Admission: RE | Admit: 2014-08-01 | Discharge: 2014-08-01 | Disposition: A | Discharge status: Home / Self Care | Payer: Managed Care, Other (non HMO) | Source: Ambulatory Visit | Attending: Orthopedic Surgery | Admitting: Orthopedic Surgery

## 2014-08-01 ENCOUNTER — Encounter (HOSPITAL_COMMUNITY): Payer: Self-pay

## 2014-08-01 DIAGNOSIS — K219 Gastro-esophageal reflux disease without esophagitis: Secondary | ICD-10-CM | POA: Diagnosis not present

## 2014-08-01 DIAGNOSIS — M75101 Unspecified rotator cuff tear or rupture of right shoulder, not specified as traumatic: Secondary | ICD-10-CM | POA: Insufficient documentation

## 2014-08-01 DIAGNOSIS — Z01818 Encounter for other preprocedural examination: Secondary | ICD-10-CM | POA: Diagnosis not present

## 2014-08-01 DIAGNOSIS — Z01812 Encounter for preprocedural laboratory examination: Secondary | ICD-10-CM | POA: Insufficient documentation

## 2014-08-01 HISTORY — DX: Headache, unspecified: R51.9

## 2014-08-01 HISTORY — DX: Adverse effect of unspecified anesthetic, initial encounter: T41.45XA

## 2014-08-01 HISTORY — DX: Other complications of anesthesia, initial encounter: T88.59XA

## 2014-08-01 HISTORY — DX: Unspecified osteoarthritis, unspecified site: M19.90

## 2014-08-01 HISTORY — DX: Headache: R51

## 2014-08-01 HISTORY — DX: Gastro-esophageal reflux disease without esophagitis: K21.9

## 2014-08-01 LAB — BASIC METABOLIC PANEL
ANION GAP: 12 (ref 5–15)
BUN: 12 mg/dL (ref 6–20)
CO2: 25 mmol/L (ref 22–32)
Calcium: 9 mg/dL (ref 8.9–10.3)
Chloride: 102 mmol/L (ref 101–111)
Creatinine, Ser: 0.97 mg/dL (ref 0.61–1.24)
GFR calc non Af Amer: >60 mL/min (ref >60)
Glucose, Bld: 395 mg/dL — ABNORMAL HIGH (ref 65–99)
POTASSIUM: 4.4 mmol/L (ref 3.5–5.1)
Sodium: 139 mmol/L (ref 135–145)

## 2014-08-01 LAB — SURGICAL PCR SCREEN
MRSA, PCR: NEGATIVE
STAPHYLOCOCCUS AUREUS: NEGATIVE

## 2014-08-01 LAB — CBC
HCT: 48.8 % (ref 39.0–52.0)
HEMOGLOBIN: 16.7 g/dL (ref 13.0–17.0)
MCH: 30.7 pg (ref 26.0–34.0)
MCHC: 34.2 g/dL (ref 30.0–36.0)
MCV: 89.7 fL (ref 78.0–100.0)
Platelets: 174 10*3/uL (ref 150–400)
RBC: 5.44 MIL/uL (ref 4.22–5.81)
RDW: 12.6 % (ref 11.5–15.5)
WBC: 8.1 10*3/uL (ref 4.0–10.5)

## 2014-08-01 NOTE — Progress Notes (Signed)
Patient has area under right forearm, that appears like a bug bite.  Center area with redness around alittle smaller than a quarter.   I have spoken with Dr. Ranell PatrickNorris who is calling in some antibiotic for the patient.  DA

## 2014-08-01 NOTE — Pre-Procedure Instructions (Signed)
    Tennis ShipJoshua Clarke  08/01/2014      [facility] - [place], [place] - [address] [place] [id number] Phone: [phone] Fax: [phone]    Your procedure is scheduled on : Wednesday, June 8th   Report to [facility] Admitting at 12:15 PM  Call this number if you have problems the morning of surgery:  [phone]   Remember:  Do not eat food or drink liquids (this includes water) after midnight.   Take these medicines the morning of surgery with A SIP OF WATER :NONE             Stop taking any anti-inflammatories 4-5 days prior to surgery.   Do not wear jewelry - no rings or watches.  Do not wear lotions or colognes.   You may NOT wear deodorant the day of surgery.             Men may shave face and neck.   Do not bring valuables to the hospital.  [facility] is [facility] responsible for any belongings or valuables.  Contacts, dentures or bridgework may not be worn into surgery.  Leave your suitcase in the car.  After surgery it may be brought to your room.  For patients admitted to the hospital, discharge time will be determined by your treatment team.  Patients discharged the day of surgery will not be allowed to drive home, and will need someone to stay with you for 24 hrs after surgery.  Name and phone number of your driver:     Special instructions:  "Preparing for Surgery" instruction sheet.  Please read over the following fact sheets that you were given. Pain Booklet, MRSA Information and Surgical Site Infection Prevention

## 2014-08-02 NOTE — H&P (Signed)
Rodney Clarke is an 29 y.o. male.    Chief Complaint: right shoulder pain  HPI: Pt is a 29 y.o. male complaining of right shoulder pain for multiple months. Pain had continually increased since the beginning. X-rays in the clinic show rotator cuff tear right shoulder. Pt has tried various conservative treatments which have failed to alleviate their symptoms, including injections and therapy. Various options are discussed with the patient. Risks, benefits and expectations were discussed with the patient. Patient understand the risks, benefits and expectations and wishes to proceed with surgery.   PCP:  London Pepper, MD  D/C Plans: Home  PMH: Past Medical History  Diagnosis Date  . Hyperchloremia   . Complication of anesthesia     very sensitive to drugs....doesn't take alot to put him under.  Marland Kitchen GERD (gastroesophageal reflux disease)     usually takes no meds  . Headache     more so d/t recent neck injury  . Arthritis     PSH: Past Surgical History  Procedure Laterality Date  . Knee arthroplasty    . Shoulder arthroscopy    . Limb salvage procedure      2001  . Tonsillectomy    . Tubes in ears      Social History:  reports that he has never smoked. He has never used smokeless tobacco. He reports that he drinks about 1.8 oz of alcohol per week. He reports that he does not use illicit drugs.  Allergies:  Allergies  Allergen Reactions  . Codeine Other (See Comments)    Hallucinations  . Promethazine Hcl Other (See Comments)    Hallucinations     Medications: No current facility-administered medications for this encounter.   Current Outpatient Prescriptions  Medication Sig Dispense Refill  . ibuprofen (ADVIL,MOTRIN) 200 MG tablet Take 200-400 mg by mouth every 6 (six) hours as needed.    . naproxen sodium (ANAPROX) 220 MG tablet Take 220-440 mg by mouth 2 (two) times daily with a meal.      Results for orders placed or performed during the hospital encounter of  08/01/14 (from the past 48 hour(s))  Basic metabolic panel     Status: Abnormal   Collection Time: 08/01/14  1:52 PM  Result Value Ref Range   Sodium 139 135 - 145 mmol/L   Potassium 4.4 3.5 - 5.1 mmol/L   Chloride 102 101 - 111 mmol/L   CO2 25 22 - 32 mmol/L   Glucose, Bld 395 (H) 65 - 99 mg/dL   BUN 12 6 - 20 mg/dL   Creatinine, Ser 0.97 0.61 - 1.24 mg/dL   Calcium 9.0 8.9 - 10.3 mg/dL   GFR calc non Af Amer >60 >60 mL/min   GFR calc Af Amer >60 >60 mL/min    Comment: (NOTE) The eGFR has been calculated using the CKD EPI equation. This calculation has not been validated in all clinical situations. eGFR's persistently <60 mL/min signify possible Chronic Kidney Disease.    Anion gap 12 5 - 15  CBC     Status: None   Collection Time: 08/01/14  1:52 PM  Result Value Ref Range   WBC 8.1 4.0 - 10.5 K/uL   RBC 5.44 4.22 - 5.81 MIL/uL   Hemoglobin 16.7 13.0 - 17.0 g/dL   HCT 48.8 39.0 - 52.0 %   MCV 89.7 78.0 - 100.0 fL   MCH 30.7 26.0 - 34.0 pg   MCHC 34.2 30.0 - 36.0 g/dL   RDW 12.6 11.5 -  15.5 %   Platelets 174 150 - 400 K/uL  Surgical pcr screen     Status: None   Collection Time: 08/01/14  1:52 PM  Result Value Ref Range   MRSA, PCR NEGATIVE NEGATIVE   Staphylococcus aureus NEGATIVE NEGATIVE    Comment:        The Xpert SA Assay (FDA approved for NASAL specimens in patients over 25 years of age), is one component of a comprehensive surveillance program.  Test performance has been validated by [facility] for patients greater than or equal to 38 year old. It is not intended to diagnose infection nor to guide or monitor treatment.    No results found.  ROS: Pain with rom of the right upper extremity  Physical Exam:  Alert and oriented 29 y.o. male in no acute distress Cranial nerves 2-12 intact Cervical spine: full rom with no tenderness, nv intact distally Chest: active breath sounds bilaterally, no wheeze rhonchi or rales Heart: regular rate and rhythm,  no murmur Abd: non tender non distended with active bowel sounds Hip is stable with rom  Right shoulder with decreased rom and weakness with ER nv intact distally No rashes or edema  Assessment/Plan Assessment: right shoulder rotator cuff tear  Plan: Patient will undergo a right shoulder rotator cuff repair by Dr. Veverly Fells at [facility]. Risks benefits and expectations were discussed with the patient. Patient understand risks, benefits and expectations and wishes to proceed.

## 2014-08-08 ENCOUNTER — Ambulatory Visit (HOSPITAL_COMMUNITY)
Admission: RE | Admit: 2014-08-08 | Payer: Managed Care, Other (non HMO) | Source: Ambulatory Visit | Admitting: Orthopedic Surgery

## 2014-08-08 ENCOUNTER — Encounter (HOSPITAL_COMMUNITY): Admission: RE | Payer: Self-pay | Source: Ambulatory Visit

## 2014-08-08 SURGERY — SHOULDER ARTHROSCOPY WITH SUBACROMIAL DECOMPRESSION, ROTATOR CUFF REPAIR AND BICEP TENDON REPAIR
Anesthesia: Choice | Site: Shoulder | Laterality: Right

## 2014-08-22 ENCOUNTER — Encounter (HOSPITAL_COMMUNITY)
Admission: RE | Admit: 2014-08-22 | Discharge: 2014-08-22 | Disposition: A | Discharge status: Home / Self Care | Payer: Managed Care, Other (non HMO) | Source: Ambulatory Visit | Attending: Orthopedic Surgery | Admitting: Orthopedic Surgery

## 2014-08-22 ENCOUNTER — Encounter (HOSPITAL_COMMUNITY): Payer: Self-pay

## 2014-08-22 DIAGNOSIS — E780 Pure hypercholesterolemia: Secondary | ICD-10-CM | POA: Insufficient documentation

## 2014-08-22 DIAGNOSIS — Z01812 Encounter for preprocedural laboratory examination: Secondary | ICD-10-CM | POA: Insufficient documentation

## 2014-08-22 DIAGNOSIS — K219 Gastro-esophageal reflux disease without esophagitis: Secondary | ICD-10-CM | POA: Insufficient documentation

## 2014-08-22 DIAGNOSIS — E119 Type 2 diabetes mellitus without complications: Secondary | ICD-10-CM | POA: Insufficient documentation

## 2014-08-22 DIAGNOSIS — Z01818 Encounter for other preprocedural examination: Secondary | ICD-10-CM | POA: Insufficient documentation

## 2014-08-22 HISTORY — DX: Presence of spectacles and contact lenses: Z97.3

## 2014-08-22 LAB — BASIC METABOLIC PANEL
ANION GAP: 9 (ref 5–15)
BUN: 9 mg/dL (ref 6–20)
CHLORIDE: 104 mmol/L (ref 101–111)
CO2: 24 mmol/L (ref 22–32)
Calcium: 9.3 mg/dL (ref 8.9–10.3)
Creatinine, Ser: 0.88 mg/dL (ref 0.61–1.24)
GFR calc Af Amer: >60 mL/min (ref >60)
GFR calc non Af Amer: >60 mL/min (ref >60)
Glucose, Bld: 389 mg/dL — ABNORMAL HIGH (ref 65–99)
POTASSIUM: 3.9 mmol/L (ref 3.5–5.1)
Sodium: 137 mmol/L (ref 135–145)

## 2014-08-22 LAB — CBC
HCT: 48.6 % (ref 39.0–52.0)
Hemoglobin: 16.7 g/dL (ref 13.0–17.0)
MCH: 30.7 pg (ref 26.0–34.0)
MCHC: 34.4 g/dL (ref 30.0–36.0)
MCV: 89.3 fL (ref 78.0–100.0)
Platelets: 145 10*3/uL — ABNORMAL LOW (ref 150–400)
RBC: 5.44 MIL/uL (ref 4.22–5.81)
RDW: 12.6 % (ref 11.5–15.5)
WBC: 6.9 10*3/uL (ref 4.0–10.5)

## 2014-08-22 NOTE — Pre-Procedure Instructions (Addendum)
Issaiah Seemann  08/22/2014      [facility] - [place], [place] - [address] [place] [address] Phone: [phone] Fax: [phone]    Your procedure is scheduled on : Friday, August 24 2014   Report to [facility] Admitting a 830 AM  Call this number if you have problems the morning of surgery:  [phone]   Remember:  Do not eat food or drink liquids (this includes water) after midnight Thursday, August 23, 2014   Take these medicines the morning of surgery with A SIP OF WATER :NONE           STOP all herbel meds, nsaids (aleve,naproxen,avitaminsdvil,ibuprofen) starting now including aspirin,    Do not wear jewelry - no rings or watches.  Do not wear lotions or colognes.   You may NOT wear deodorant the day of surgery.             Men may shave face and neck.   Do not bring valuables to the hospital.  [facility] is not responsible for any belongings or valuables.  Contacts, dentures or bridgework may not be worn into surgery.  Leave your suitcase in the car.  After surgery it may be brought to your room.  For patients admitted to the hospital, discharge time will be determined by your treatment team.  Patients discharged the day of surgery will not be allowed to drive home, and will need someone to stay with you for 24 hrs after surgery.  Name and phone number of your driver:     Special instructions:  " Special Instructions:  - Preparing for Surgery  Before surgery, you can play an important role.  Because skin is not sterile, your skin needs to be as free of germs as possible.  You can reduce the number of germs on you skin by washing with CHG (chlorahexidine gluconate) soap before surgery.  CHG is an antiseptic cleaner which kills germs and bonds with the skin to continue killing germs even after washing.  Please DO NOT use if you have an allergy to CHG or antibacterial soaps.  If your skin becomes  reddened/irritated stop using the CHG and inform your nurse when you arrive at Short Stay.  Do not shave (including legs and underarms) for at least 48 hours prior to the first CHG shower.  You may shave your face.  Please follow these instructions carefully:   1.  Shower with CHG Soap the night before surgery and the morning of Surgery.  2.  If you choose to wash your hair, wash your hair first as usual with your normal shampoo.  3.  After you shampoo, rinse your hair and body thoroughly to remove the Shampoo.  4.  Use CHG as you would any other liquid soap.  You can apply chg directly  to the skin and wash gently with scrungie or a clean washcloth.  5.  Apply the CHG Soap to your body ONLY FROM THE NECK DOWN.  Do not use on open wounds or open sores.  Avoid contact with your eyes ears, mouth and genitals (private parts).  Wash genitals (private parts)       with your normal soap.  6.  Wash thoroughly, paying special attention to the area where your surgery will be performed.  7.  Thoroughly rinse your body with warm water from the neck down.  8.  DO NOT shower/wash with  your normal soap after using and rinsing off the CHG Soap.  9.  Pat yourself dry with a clean towel.            10.  Wear clean pajamas.            11.  Place clean sheets on your bed the night of your first shower and do not sleep with pets.  Day of Surgery  Do not apply any lotions/deodorants the morning of surgery.  Please wear clean clothes to the hospital/surgery center.  Please read over the following fact sheets that you were given. Pain Booklet, MRSA Information and Surgical Site Infection Prevention

## 2014-08-23 ENCOUNTER — Encounter (HOSPITAL_COMMUNITY): Payer: Self-pay | Admitting: Emergency Medicine

## 2014-08-23 NOTE — Progress Notes (Signed)
Anesthesia Chart Review:  Pt is 29 year old male scheduled for R shoulder arthroscopy with subacromial decompression, mini-open rotator cuff repair, open bicep tendonodesis on 08/24/2014 with Dr. Ranell Patrick.   PMH includes: hypercholesterolemia, GERD. Never smoker. BMI 42.  Preoperative labs reviewed.  Glucose 389. Pt does not have known dx of DM.   Notified Lori in Dr. Ranell Patrick' office that pt will need to be evaluated and started on treatment for DM, and that blood sugar will need to be under control prior to surgery.   Rica Mast, FNP-BC [address] [facility]/Anesthesiology Phone: [phone] 08/23/2014 9:33 AM

## 2014-08-24 ENCOUNTER — Ambulatory Visit (HOSPITAL_COMMUNITY)
Admission: RE | Admit: 2014-08-24 | Payer: Managed Care, Other (non HMO) | Source: Ambulatory Visit | Admitting: Orthopedic Surgery

## 2014-08-24 ENCOUNTER — Encounter (HOSPITAL_COMMUNITY): Admission: RE | Payer: Self-pay | Source: Ambulatory Visit

## 2014-08-24 SURGERY — SHOULDER ARTHROSCOPY WITH SUBACROMIAL DECOMPRESSION AND OPEN ROTATOR CUFF REPAIR, OPEN BICEPS TENDON REPAIR
Anesthesia: Choice | Laterality: Right

## 2014-10-04 NOTE — H&P (Signed)
  Rodney Clarke is an 29 y.o. male.    Chief Complaint: right shoulder pain  HPI: Pt is a 29 y.o. male complaining of right shoulder pain for multiple months. Pain had continually increased since the beginning. X-rays in the clinic show SLAP tear right shoulder. Pt has tried various conservative treatments which have failed to alleviate their symptoms, including injections and therapy. Various options are discussed with the patient. Risks, benefits and expectations were discussed with the patient. Patient understand the risks, benefits and expectations and wishes to proceed with surgery.   PCP:  Farris Has, MD  D/C Plans: Home  PMH: Past Medical History  Diagnosis Date  . Hyperchloremia   . Complication of anesthesia     very sensitive to drugs....doesn't take alot to put him under.  Marland Kitchen GERD (gastroesophageal reflux disease)     usually takes no meds  . Headache     more so d/t recent neck injury  . Arthritis   . Wears glasses     PSH: Past Surgical History  Procedure Laterality Date  . Knee arthroplasty    . Shoulder arthroscopy    . Limb salvage procedure      2001  . Tonsillectomy    . Tubes in ears    . Wrist arthroscopy      right    Social History:  reports that he has never smoked. He has never used smokeless tobacco. He reports that he drinks about 1.8 oz of alcohol per week. He reports that he does not use illicit drugs.  Allergies:  Allergies  Allergen Reactions  . Codeine Other (See Comments)    Hallucinations  . Promethazine Hcl Other (See Comments)    Hallucinations     Medications: No current facility-administered medications for this encounter.   Current Outpatient Prescriptions  Medication Sig Dispense Refill  . ibuprofen (ADVIL,MOTRIN) 800 MG tablet Take 800 mg by mouth at bedtime as needed (pain).      No results found for this or any previous visit (from the past 48 hour(s)). No results found.  ROS: Pain with rom of the right upper  extremity  Physical Exam:  Alert and oriented 29 y.o. male in no acute distress Cranial nerves 2-12 intact Cervical spine: full rom with no tenderness, nv intact distally Chest: active breath sounds bilaterally, no wheeze rhonchi or rales Heart: regular rate and rhythm, no murmur Abd: non tender non distended with active bowel sounds Hip is stable with rom  Right shoulder with positive obrien's test nv intact distally No rashes or edema Strength of ER and IR limited by pain  Assessment/Plan Assessment: right shoulder pain secondary to SLAP lesion  Plan: Patient will undergo a right shoulder scope and tenodesis by Dr. Ranell Patrick at [facility]. Risks benefits and expectations were discussed with the patient. Patient understand risks, benefits and expectations and wishes to proceed.

## 2014-10-10 ENCOUNTER — Encounter (HOSPITAL_COMMUNITY)
Admission: RE | Admit: 2014-10-10 | Discharge: 2014-10-10 | Disposition: A | Discharge status: Home / Self Care | Payer: Managed Care, Other (non HMO) | Source: Ambulatory Visit | Attending: Orthopedic Surgery | Admitting: Orthopedic Surgery

## 2014-10-10 ENCOUNTER — Encounter (HOSPITAL_COMMUNITY): Payer: Self-pay

## 2014-10-10 DIAGNOSIS — Z01810 Encounter for preprocedural cardiovascular examination: Secondary | ICD-10-CM | POA: Diagnosis not present

## 2014-10-10 DIAGNOSIS — M75101 Unspecified rotator cuff tear or rupture of right shoulder, not specified as traumatic: Secondary | ICD-10-CM | POA: Insufficient documentation

## 2014-10-10 DIAGNOSIS — E119 Type 2 diabetes mellitus without complications: Secondary | ICD-10-CM | POA: Insufficient documentation

## 2014-10-10 DIAGNOSIS — Z01812 Encounter for preprocedural laboratory examination: Secondary | ICD-10-CM | POA: Insufficient documentation

## 2014-10-10 HISTORY — DX: Type 2 diabetes mellitus without complications: E11.9

## 2014-10-10 LAB — SURGICAL PCR SCREEN
MRSA, PCR: NEGATIVE
Staphylococcus aureus: NEGATIVE

## 2014-10-10 LAB — BASIC METABOLIC PANEL
ANION GAP: 9 (ref 5–15)
BUN: 13 mg/dL (ref 6–20)
CHLORIDE: 108 mmol/L (ref 101–111)
CO2: 26 mmol/L (ref 22–32)
Calcium: 9.5 mg/dL (ref 8.9–10.3)
Creatinine, Ser: 0.89 mg/dL (ref 0.61–1.24)
GFR calc Af Amer: >60 mL/min (ref >60)
GFR calc non Af Amer: >60 mL/min (ref >60)
Glucose, Bld: 141 mg/dL — ABNORMAL HIGH (ref 65–99)
Potassium: 4.2 mmol/L (ref 3.5–5.1)
Sodium: 143 mmol/L (ref 135–145)

## 2014-10-10 LAB — CBC
HEMATOCRIT: 51 % (ref 39.0–52.0)
Hemoglobin: 17.6 g/dL — ABNORMAL HIGH (ref 13.0–17.0)
MCH: 31.5 pg (ref 26.0–34.0)
MCHC: 34.5 g/dL (ref 30.0–36.0)
MCV: 91.4 fL (ref 78.0–100.0)
Platelets: 178 10*3/uL (ref 150–400)
RBC: 5.58 MIL/uL (ref 4.22–5.81)
RDW: 12.5 % (ref 11.5–15.5)
WBC: 8.2 10*3/uL (ref 4.0–10.5)

## 2014-10-10 LAB — GLUCOSE, CAPILLARY: Glucose-Capillary: 133 mg/dL — ABNORMAL HIGH (ref 65–99)

## 2014-10-10 NOTE — Progress Notes (Signed)
PCP is Farris Has.

## 2014-10-10 NOTE — Pre-Procedure Instructions (Signed)
Rodney Clarke  10/10/2014      [facility] - [place], [place] - [address] [place] [address] Phone: [phone] Fax: [phone]    Your procedure is scheduled on Thursday, August 18th.   Report to [facility] Admitting at 8:30 AM.  Call this number if you have problems the morning of surgery:  [phone]   Remember:  Do not eat food or drink liquids after midnight Wednesday.  Take these medicines the morning of surgery with A SIP OF WATER : NONE   Do not wear jewelry - no rings or watches.  Do not wear lotions or colognes.   You may NOT wear deodorant the day of surgery.             Men may shave face and neck.   Do not bring valuables to the hospital.  [facility] is not responsible for any belongings or valuables.  Contacts, dentures or bridgework may not be worn into surgery.  Leave your suitcase in the car.  After surgery it may be brought to your room. For patients admitted to the hospital, discharge time will be determined by your treatment team.  Name and phone number of your driver:     Special instructions:  "Preparing for Surgery" instruction sheet.  Please read over the following fact sheets that you were given. Pain Booklet, Coughing and Deep Breathing, MRSA Information and Surgical Site Infection Prevention

## 2014-10-11 LAB — HEMOGLOBIN A1C
Hgb A1c MFr Bld: 7.6 % — ABNORMAL HIGH (ref 4.8–5.6)
MEAN PLASMA GLUCOSE: 171 mg/dL

## 2014-10-18 MED ORDER — DEXTROSE 5 % IV SOLN
3.0000 g | INTRAVENOUS | Status: AC
  Administered 2014-10-19: 3 g via INTRAVENOUS
  Filled 2014-10-18 (×2): qty 3000

## 2014-10-18 MED ORDER — CHLORHEXIDINE GLUCONATE 4 % EX LIQD: 60.0000 mL | Freq: Once | CUTANEOUS | Status: DC

## 2014-10-19 ENCOUNTER — Ambulatory Visit (HOSPITAL_COMMUNITY): Payer: Managed Care, Other (non HMO) | Admitting: Anesthesiology

## 2014-10-19 ENCOUNTER — Encounter (HOSPITAL_COMMUNITY): Payer: Self-pay | Admitting: *Deleted

## 2014-10-19 ENCOUNTER — Encounter (HOSPITAL_COMMUNITY)
Admission: RE | Disposition: A | Discharge status: Home / Self Care | Payer: Self-pay | Source: Ambulatory Visit | Attending: Orthopedic Surgery

## 2014-10-19 ENCOUNTER — Ambulatory Visit (HOSPITAL_COMMUNITY)
Admission: RE | Admit: 2014-10-19 | Discharge: 2014-10-19 | Disposition: A | Discharge status: Home / Self Care | Payer: Managed Care, Other (non HMO) | Source: Ambulatory Visit | Attending: Orthopedic Surgery | Admitting: Orthopedic Surgery

## 2014-10-19 DIAGNOSIS — M25511 Pain in right shoulder: Secondary | ICD-10-CM | POA: Insufficient documentation

## 2014-10-19 DIAGNOSIS — Z79899 Other long term (current) drug therapy: Secondary | ICD-10-CM | POA: Insufficient documentation

## 2014-10-19 DIAGNOSIS — M75101 Unspecified rotator cuff tear or rupture of right shoulder, not specified as traumatic: Secondary | ICD-10-CM | POA: Insufficient documentation

## 2014-10-19 DIAGNOSIS — X58XXXA Exposure to other specified factors, initial encounter: Secondary | ICD-10-CM | POA: Diagnosis not present

## 2014-10-19 DIAGNOSIS — Z6839 Body mass index (BMI) 39.0-39.9, adult: Secondary | ICD-10-CM | POA: Insufficient documentation

## 2014-10-19 DIAGNOSIS — S43431A Superior glenoid labrum lesion of right shoulder, initial encounter: Secondary | ICD-10-CM | POA: Insufficient documentation

## 2014-10-19 DIAGNOSIS — M1990 Unspecified osteoarthritis, unspecified site: Secondary | ICD-10-CM | POA: Insufficient documentation

## 2014-10-19 DIAGNOSIS — Z885 Allergy status to narcotic agent status: Secondary | ICD-10-CM | POA: Diagnosis not present

## 2014-10-19 DIAGNOSIS — K219 Gastro-esophageal reflux disease without esophagitis: Secondary | ICD-10-CM | POA: Insufficient documentation

## 2014-10-19 DIAGNOSIS — E119 Type 2 diabetes mellitus without complications: Secondary | ICD-10-CM | POA: Insufficient documentation

## 2014-10-19 HISTORY — PX: SHOULDER ARTHROSCOPY WITH SUBACROMIAL DECOMPRESSION AND OPEN ROTATOR C: SHX5688

## 2014-10-19 LAB — GLUCOSE, CAPILLARY
Glucose-Capillary: 128 mg/dL — ABNORMAL HIGH (ref 65–99)
Glucose-Capillary: 154 mg/dL — ABNORMAL HIGH (ref 65–99)

## 2014-10-19 SURGERY — SHOULDER ARTHROSCOPY WITH SUBACROMIAL DECOMPRESSION AND OPEN ROTATOR CUFF REPAIR, OPEN BICEPS TENDON REPAIR
Anesthesia: Regional | Site: Shoulder | Laterality: Right

## 2014-10-19 MED ORDER — NEOSTIGMINE METHYLSULFATE 10 MG/10ML IV SOLN
INTRAVENOUS | Status: AC
  Filled 2014-10-19: qty 1

## 2014-10-19 MED ORDER — BUPIVACAINE-EPINEPHRINE (PF) 0.5% -1:200000 IJ SOLN
INTRAMUSCULAR | Status: DC | PRN
  Administered 2014-10-19: 25 mL via PERINEURAL

## 2014-10-19 MED ORDER — LACTATED RINGERS IV SOLN
INTRAVENOUS | Status: DC
  Administered 2014-10-19 (×2): via INTRAVENOUS

## 2014-10-19 MED ORDER — ONDANSETRON HCL 4 MG/2ML IJ SOLN
INTRAMUSCULAR | Status: AC
  Filled 2014-10-19: qty 2

## 2014-10-19 MED ORDER — FENTANYL CITRATE (PF) 250 MCG/5ML IJ SOLN
INTRAMUSCULAR | Status: AC
  Filled 2014-10-19: qty 5

## 2014-10-19 MED ORDER — ONDANSETRON HCL 4 MG/2ML IJ SOLN
INTRAMUSCULAR | Status: DC | PRN
Start: 2014-10-19 — End: 2014-10-19
  Administered 2014-10-19: 4 mg via INTRAVENOUS

## 2014-10-19 MED ORDER — BUPIVACAINE-EPINEPHRINE (PF) 0.25% -1:200000 IJ SOLN
INTRAMUSCULAR | Status: AC
  Filled 2014-10-19: qty 30

## 2014-10-19 MED ORDER — PROPOFOL 10 MG/ML IV BOLUS
INTRAVENOUS | Status: AC
Start: 2014-10-19 — End: 2014-10-19
  Filled 2014-10-19: qty 20

## 2014-10-19 MED ORDER — LIDOCAINE HCL (CARDIAC) 20 MG/ML IV SOLN
INTRAVENOUS | Status: DC | PRN
  Administered 2014-10-19: 50 mg via INTRAVENOUS

## 2014-10-19 MED ORDER — FENTANYL CITRATE (PF) 100 MCG/2ML IJ SOLN
INTRAMUSCULAR | Status: AC
  Administered 2014-10-19: 50 ug via INTRAVENOUS
  Filled 2014-10-19: qty 2

## 2014-10-19 MED ORDER — SUCCINYLCHOLINE CHLORIDE 20 MG/ML IJ SOLN
INTRAMUSCULAR | Status: DC | PRN
  Administered 2014-10-19: 100 mg via INTRAVENOUS

## 2014-10-19 MED ORDER — GLYCOPYRROLATE 0.2 MG/ML IJ SOLN
INTRAMUSCULAR | Status: AC
  Filled 2014-10-19: qty 3

## 2014-10-19 MED ORDER — BUPIVACAINE-EPINEPHRINE 0.25% -1:200000 IJ SOLN
INTRAMUSCULAR | Status: DC | PRN
  Administered 2014-10-19: 7 mL

## 2014-10-19 MED ORDER — FENTANYL CITRATE (PF) 100 MCG/2ML IJ SOLN
INTRAMUSCULAR | Status: DC | PRN
  Administered 2014-10-19: 100 ug via INTRAVENOUS
  Administered 2014-10-19: 50 ug via INTRAVENOUS

## 2014-10-19 MED ORDER — LIDOCAINE HCL (CARDIAC) 20 MG/ML IV SOLN
INTRAVENOUS | Status: AC
  Filled 2014-10-19: qty 5

## 2014-10-19 MED ORDER — PROPOFOL 10 MG/ML IV BOLUS
INTRAVENOUS | Status: DC | PRN
  Administered 2014-10-19: 200 mg via INTRAVENOUS
  Administered 2014-10-19: 100 mg via INTRAVENOUS

## 2014-10-19 MED ORDER — PROPOFOL 10 MG/ML IV BOLUS
INTRAVENOUS | Status: AC
  Filled 2014-10-19: qty 20

## 2014-10-19 MED ORDER — FENTANYL CITRATE (PF) 100 MCG/2ML IJ SOLN
50.0000 ug | INTRAMUSCULAR | Status: DC | PRN
  Administered 2014-10-19: 50 ug via INTRAVENOUS

## 2014-10-19 MED ORDER — GLYCOPYRROLATE 0.2 MG/ML IJ SOLN
INTRAMUSCULAR | Status: DC | PRN
  Administered 2014-10-19: 0.6 mg via INTRAVENOUS

## 2014-10-19 MED ORDER — MIDAZOLAM HCL 2 MG/2ML IJ SOLN
INTRAMUSCULAR | Status: AC
  Administered 2014-10-19: 1 mg via INTRAVENOUS
  Filled 2014-10-19: qty 2

## 2014-10-19 MED ORDER — HYDROMORPHONE HCL 2 MG PO TABS: 2.0000 mg | ORAL_TABLET | ORAL | Status: AC | PRN

## 2014-10-19 MED ORDER — OXYCODONE HCL 5 MG PO TABS: 5.0000 mg | ORAL_TABLET | Freq: Once | ORAL | Status: DC | PRN

## 2014-10-19 MED ORDER — MIDAZOLAM HCL 2 MG/2ML IJ SOLN
INTRAMUSCULAR | Status: AC
  Filled 2014-10-19: qty 4

## 2014-10-19 MED ORDER — HYDROMORPHONE HCL 1 MG/ML IJ SOLN: 0.2500 mg | INTRAMUSCULAR | Status: DC | PRN

## 2014-10-19 MED ORDER — NEOSTIGMINE METHYLSULFATE 10 MG/10ML IV SOLN
INTRAVENOUS | Status: DC | PRN
  Administered 2014-10-19: 5 mg via INTRAVENOUS

## 2014-10-19 MED ORDER — KETOROLAC TROMETHAMINE 30 MG/ML IJ SOLN: 30.0000 mg | Freq: Once | INTRAMUSCULAR | Status: DC

## 2014-10-19 MED ORDER — OXYCODONE HCL 5 MG/5ML PO SOLN: 5.0000 mg | Freq: Once | ORAL | Status: DC | PRN

## 2014-10-19 MED ORDER — MIDAZOLAM HCL 2 MG/2ML IJ SOLN
1.0000 mg | INTRAMUSCULAR | Status: DC | PRN
  Administered 2014-10-19: 1 mg via INTRAVENOUS

## 2014-10-19 MED ORDER — METOCLOPRAMIDE HCL 5 MG/ML IJ SOLN: 10.0000 mg | Freq: Once | INTRAMUSCULAR | Status: DC | PRN

## 2014-10-19 MED ORDER — ROCURONIUM BROMIDE 100 MG/10ML IV SOLN
INTRAVENOUS | Status: DC | PRN
  Administered 2014-10-19: 50 mg via INTRAVENOUS

## 2014-10-19 MED ORDER — SODIUM CHLORIDE 0.9 % IR SOLN
Status: DC | PRN
  Administered 2014-10-19: 6000 mL

## 2014-10-19 MED ORDER — OXYCODONE-ACETAMINOPHEN 7.5-325 MG PO TABS: 1.0000 | ORAL_TABLET | ORAL | Status: AC | PRN

## 2014-10-19 MED ORDER — METHOCARBAMOL 500 MG PO TABS: 500.0000 mg | ORAL_TABLET | Freq: Three times a day (TID) | ORAL | Status: AC | PRN

## 2014-10-19 SURGICAL SUPPLY — 64 items
ANCH SUT 360D 2 2 LD 2 STRN (Anchor) ×1 IMPLANT
ANCHOR ALL- SUT RC 2 SUT Y-K (Anchor) ×2 IMPLANT
ANCHOR ALL-SUT RC 2 SUT Y-K (Anchor) ×1 IMPLANT
BLADE SURG 11 STRL SS (BLADE) ×3 IMPLANT
BLADE W/14.0X25.5MM (BLADE) ×3 IMPLANT
BUR OVAL 4.0 (BURR) IMPLANT
CLOSURE STERI-STRIP 1/2X4 (GAUZE/BANDAGES/DRESSINGS) ×1
CLOSURE WOUND 1/2 X4 (GAUZE/BANDAGES/DRESSINGS) ×1
CLSR STERI-STRIP ANTIMIC 1/2X4 (GAUZE/BANDAGES/DRESSINGS) ×2 IMPLANT
COVER SURGICAL LIGHT HANDLE (MISCELLANEOUS) ×3 IMPLANT
DRAPE INCISE IOBAN 66X45 STRL (DRAPES) ×3 IMPLANT
DRAPE STERI 35X30 U-POUCH (DRAPES) ×3 IMPLANT
DRAPE U-SHAPE 47X51 STRL (DRAPES) ×3 IMPLANT
DRSG EMULSION OIL 3X3 NADH (GAUZE/BANDAGES/DRESSINGS) ×3 IMPLANT
DRSG PAD ABDOMINAL 8X10 ST (GAUZE/BANDAGES/DRESSINGS) ×3 IMPLANT
DURAPREP 26ML APPLICATOR (WOUND CARE) ×3 IMPLANT
ELECT NEEDLE TIP 2.8 STRL (NEEDLE) ×3 IMPLANT
ELECT REM PT RETURN 9FT ADLT (ELECTROSURGICAL)
ELECTRODE REM PT RTRN 9FT ADLT (ELECTROSURGICAL) IMPLANT
GAUZE SPONGE 4X4 12PLY STRL (GAUZE/BANDAGES/DRESSINGS) ×3 IMPLANT
GLOVE BIOGEL PI ORTHO PRO 7.5 (GLOVE) ×2
GLOVE BIOGEL PI ORTHO PRO SZ8 (GLOVE) ×2
GLOVE ORTHO TXT STRL SZ7.5 (GLOVE) ×3 IMPLANT
GLOVE PI ORTHO PRO STRL 7.5 (GLOVE) ×1 IMPLANT
GLOVE PI ORTHO PRO STRL SZ8 (GLOVE) ×1 IMPLANT
GLOVE SURG ORTHO 8.5 STRL (GLOVE) ×3 IMPLANT
GOWN STRL REUS W/ TWL XL LVL3 (GOWN DISPOSABLE) ×4 IMPLANT
GOWN STRL REUS W/TWL XL LVL3 (GOWN DISPOSABLE) ×12
KIT BASIN OR (CUSTOM PROCEDURE TRAY) ×3 IMPLANT
KIT ROOM TURNOVER OR (KITS) ×3 IMPLANT
MANIFOLD NEPTUNE II (INSTRUMENTS) ×3 IMPLANT
NDL SUT .5 MAYO 1.404X.05X (NEEDLE) ×1 IMPLANT
NDL SUT 6 .5 CRC .975X.05 MAYO (NEEDLE) ×1 IMPLANT
NEEDLE HYPO 25GX1X1/2 BEV (NEEDLE) ×3 IMPLANT
NEEDLE MAYO TAPER (NEEDLE) ×5
NEEDLE SPNL 18GX3.5 QUINCKE PK (NEEDLE) ×3 IMPLANT
NS IRRIG 1000ML POUR BTL (IV SOLUTION) ×3 IMPLANT
PACK SHOULDER (CUSTOM PROCEDURE TRAY) ×3 IMPLANT
PAD ARMBOARD 7.5X6 YLW CONV (MISCELLANEOUS) ×6 IMPLANT
RESECTOR FULL RADIUS 4.2MM (BLADE) ×3 IMPLANT
SET ARTHROSCOPY TUBING (MISCELLANEOUS) ×3
SET ARTHROSCOPY TUBING LN (MISCELLANEOUS) ×1 IMPLANT
SLING ARM LRG ADULT FOAM STRAP (SOFTGOODS) ×3 IMPLANT
SLING ARM MED ADULT FOAM STRAP (SOFTGOODS) IMPLANT
SPONGE GAUZE 4X4 12PLY STER LF (GAUZE/BANDAGES/DRESSINGS) ×3 IMPLANT
STRIP CLOSURE SKIN 1/2X4 (GAUZE/BANDAGES/DRESSINGS) ×2 IMPLANT
SUT BONE WAX W31G (SUTURE) ×3 IMPLANT
SUT FIBERWIRE #2 38 T-5 BLUE (SUTURE)
SUT MNCRL AB 3-0 PS2 18 (SUTURE) ×3 IMPLANT
SUT VIC AB 0 CT1 27 (SUTURE) ×3
SUT VIC AB 0 CT1 27XBRD ANBCTR (SUTURE) ×1 IMPLANT
SUT VIC AB 0 CT2 27 (SUTURE) IMPLANT
SUT VIC AB 2-0 CT1 27 (SUTURE) ×3
SUT VIC AB 2-0 CT1 TAPERPNT 27 (SUTURE) ×1 IMPLANT
SUT VICRYL 0 CT 1 36IN (SUTURE) ×3 IMPLANT
SUTURE FIBERWR #2 38 T-5 BLUE (SUTURE) IMPLANT
SYR CONTROL 10ML LL (SYRINGE) ×3 IMPLANT
TAPE CLOTH SURG 6X10 WHT LF (GAUZE/BANDAGES/DRESSINGS) ×3 IMPLANT
TOWEL OR 17X24 6PK STRL BLUE (TOWEL DISPOSABLE) ×3 IMPLANT
TOWEL OR 17X26 10 PK STRL BLUE (TOWEL DISPOSABLE) ×3 IMPLANT
TUBE CONNECTING 12'X1/4 (SUCTIONS) ×1
TUBE CONNECTING 12X1/4 (SUCTIONS) ×2 IMPLANT
WAND HAND CNTRL MULTIVAC 90 (MISCELLANEOUS) ×3 IMPLANT
WATER STERILE IRR 1000ML POUR (IV SOLUTION) ×3 IMPLANT

## 2014-10-19 NOTE — Anesthesia Procedure Notes (Addendum)
Anesthesia Regional Block:  Interscalene brachial plexus block  Pre-Anesthetic Checklist: ,, timeout performed, Correct Patient, Correct Site, Correct Laterality, Correct Procedure, Correct Position, site marked, Risks and benefits discussed,  Surgical consent,  Pre-op evaluation,  At surgeon's request and post-op pain management  Laterality: Right  Prep: chloraprep       Needles:  Injection technique: Single-shot  Needle Type: Echogenic Stimulator Needle     Needle Length: 9cm 9 cm Needle Gauge: 21 and 21 G    Additional Needles:  Procedures: ultrasound guided (picture in chart) and nerve stimulator Interscalene brachial plexus block  Nerve Stimulator or Paresthesia:  Response: deltoid, 0.5 mA,   Additional Responses:   Narrative:  Start time: 10/19/2014 10:10 AM End time: 10/19/2014 10:20 AM Injection made incrementally with aspirations every 5 mL.  Performed by: Personally  Anesthesiologist: Marcene Duos  Additional Notes: Risks and benefits explained. Pt tolerated well with no immediate complications.   Procedure Name: Intubation Date/Time: 10/19/2014 11:06 AM Performed by: Marylyn Ishihara Pre-anesthesia Checklist: Patient identified, Timeout performed, Emergency Drugs available, Suction available and Patient being monitored Patient Re-evaluated:Patient Re-evaluated prior to inductionOxygen Delivery Method: Circle system utilized Preoxygenation: Pre-oxygenation with 100% oxygen Intubation Type: IV induction and Cricoid Pressure applied Ventilation: Mask ventilation with difficulty, Two handed mask ventilation required and Oral airway inserted - appropriate to patient size Laryngoscope Size: Mac and 4 Grade View: Grade II Tube size: 7.5 mm Number of attempts: 1 Airway Equipment and Method: Stylet Placement Confirmation: ETT inserted through vocal cords under direct vision,  positive ETCO2 and breath sounds checked- equal and bilateral Secured at: 23 cm Tube  secured with: Tape Dental Injury: Teeth and Oropharynx as per pre-operative assessment  Difficulty Due To: Difficulty was anticipated

## 2014-10-19 NOTE — Transfer of Care (Signed)
Immediate Anesthesia Transfer of Care Note  Patient: Rodney Clarke  Procedure(s) Performed: Procedure(s): RIGHT SHOULDER ARTHROSCOPY WITH SUBACROMIAL DECOMPRESSION AND MINI OPEN ROTATOR CUFF REPAIR, BICEP TENDONODESIS  (Right)  Patient Location: PACU  Anesthesia Type:GA combined with regional for post-op pain  Level of Consciousness: awake, alert , oriented, patient cooperative and responds to stimulation  Airway & Oxygen Therapy: Patient Spontanous Breathing and Patient connected to face mask oxygen  Post-op Assessment: Report given to RN, Post -op Vital signs reviewed and stable and Patient able to stick tongue midline  Post vital signs: stable  Last Vitals:  Filed Vitals:   10/19/14 1040  BP: 129/80  Pulse: 83  Temp:   Resp: 17    Complications: No apparent anesthesia complications

## 2014-10-19 NOTE — Discharge Instructions (Signed)
Ice to the shoulder constantly,  May alternate with moist heat as well.  Keep the bandage in place until Monday, then change to BandAids.  Do not get the wounds wet in the shower until next Wednesday. Keep covered.  Ok to remove the sling and "hug a pillow"  Do exercises every hour for 5 minutes.  Use the sling out of the home or while up walking  Pendulums, Lap Slides, rotation exercises, assisted lifts using the left hand to grab the right wrist and lift the right arm in front  Follow up in two weeks  [phone]

## 2014-10-19 NOTE — Brief Op Note (Signed)
10/19/2014  12:48 PM  PATIENT:  Rodney Clarke  29 y.o. male  PRE-OPERATIVE DIAGNOSIS:  RIGHT SHOULDER ROTATOR CUFF TEAR  SLAP  POST-OPERATIVE DIAGNOSIS:  RIGHT SHOULDER ROTATOR CUFF TEAR  PROCEDURE:  RIGHT SHOULDER ARTHROSCOPY WITH ARTHROSCOPIC SUBACROMIAL DECOMPRESSION, MINI-OPEN ROTATOR CUFF REPAIR  SURGEON:  Surgeon(s) and Role:    * Beverely Low, MD - Primary  PHYSICIAN ASSISTANT:   ASSISTANTS: Thea Gist, PA-C   ANESTHESIA:   regional and general  EBL:     BLOOD ADMINISTERED:none  DRAINS: none   LOCAL MEDICATIONS USED:  MARCAINE     SPECIMEN:  No Specimen  DISPOSITION OF SPECIMEN:  N/A  COUNTS:  YES  TOURNIQUET:  * No tourniquets in log *  DICTATION: .Other Dictation: Dictation Number [id number]  PLAN OF CARE: Discharge to home after PACU  PATIENT DISPOSITION:  PACU - hemodynamically stable.   Delay start of Pharmacological VTE agent (>24hrs) due to surgical blood loss or risk of bleeding: not applicable

## 2014-10-19 NOTE — Anesthesia Preprocedure Evaluation (Addendum)
Anesthesia Evaluation  Patient identified by MRN, date of birth, ID band Patient awake    Reviewed: Allergy & Precautions, NPO status , Patient's Chart, lab work & pertinent test results  Airway Mallampati: III  TM Distance: >3 FB Neck ROM: Full    Dental  (+) Dental Advisory Given, Teeth Intact   Pulmonary neg pulmonary ROS,  breath sounds clear to auscultation        Cardiovascular negative cardio ROS  Rhythm:Regular Rate:Normal     Neuro/Psych negative neurological ROS     GI/Hepatic Neg liver ROS, GERD-  ,  Endo/Other  diabetes, Type 2, Oral Hypoglycemic AgentsMorbid obesity  Renal/GU negative Renal ROS     Musculoskeletal  (+) Arthritis -,   Abdominal   Peds  Hematology negative hematology ROS (+)   Anesthesia Other Findings   Reproductive/Obstetrics                            Anesthesia Physical Anesthesia Plan  ASA: II  Anesthesia Plan: General and Regional   Post-op Pain Management: GA combined w/ Regional for post-op pain   Induction: Intravenous  Airway Management Planned: Oral ETT  Additional Equipment:   Intra-op Plan:   Post-operative Plan: Extubation in OR  Informed Consent: I have reviewed the patients History and Physical, chart, labs and discussed the procedure including the risks, benefits and alternatives for the proposed anesthesia with the patient or authorized representative who has indicated his/her understanding and acceptance.   Dental advisory given  Plan Discussed with: CRNA  Anesthesia Plan Comments:        Anesthesia Quick Evaluation

## 2014-10-19 NOTE — Interval H&P Note (Signed)
History and Physical Interval Note:  10/19/2014 10:06 AM  Rodney Clarke  has presented today for surgery, with the diagnosis of RIGHT SHOULDER ROTATOR CUFF TEAR  SLAP  The various methods of treatment have been discussed with the patient and family. After consideration of risks, benefits and other options for treatment, the patient has consented to  Procedure(s): RIGHT SHOULDER ARTHROSCOPY WITH SUBACROMIAL DECOMPRESSION AND MINI OPEN ROTATOR CUFF REPAIR, BICEP TENDONODESIS  (Right) as a surgical intervention .  The patient's history has been reviewed, patient examined, no change in status, stable for surgery.  I have reviewed the patient's chart and labs.  Questions were answered to the patient's satisfaction.     Rodney Clarke,STEVEN R

## 2014-10-19 NOTE — Progress Notes (Signed)
Orthopedic Tech Progress Note Patient Details:  Rodney Clarke Jul 11, 1985 [id number]  Ortho Devices Type of Ortho Device: Abduction pillow Ortho Device/Splint Interventions: Application   [name], Mickie Bail 10/19/2014, 11:52 AM

## 2014-10-20 NOTE — Op Note (Signed)
Rodney Clarke, Rodney Clarke                 ACCOUNT NO.:  [id number]  MEDICAL RECORD NO.:  [id number]  LOCATION:  [facility]                         FACILITY:  [facility]  PHYSICIAN:  Almedia Balls. Ranell Patrick, M.D. DATE OF BIRTH:  1985/06/14  DATE OF PROCEDURE:  10/19/2014 DATE OF DISCHARGE:  10/19/2014                              OPERATIVE REPORT   PREOPERATIVE DIAGNOSIS:  Right shoulder rotator cuff tear, possible SLAP tear.  POSTOPERATIVE DIAGNOSIS:  Right shoulder rotator cuff tear.  PROCEDURE PERFORMED:  Right shoulder arthroscopy with limited intra- articular debridement including arthroscopic subacromial decompression with mini-open rotator cuff repair.  ATTENDING SURGEON:  Almedia Balls. Ranell Patrick, MD.  ASSISTANT:  Modesto Charon, PA-C who scrubbed the entire procedure and was necessary for satisfactory completion of surgery.  ANESTHESIA:  General anesthesia was used plus interscalene block.  ESTIMATED BLOOD LOSS:  Minimal.  FLUID REPLACEMENT:  1200 mL crystalloids.  INSTRUMENT COUNTS:  Correct.  COMPLICATIONS:  There were no complications.  ANTIBIOTICS:  Perioperative antibiotics were given.  INDICATIONS:  The patient is a 29 year old male with worsening right shoulder pain secondary to MRI documented rotator cuff tear.  The patient has also an anterior shoulder pain with concern over a possible SLAP tear and biceps involvement.  The patient presents now for operative shoulder surgery to eliminate pain and restore function of the shoulder.  Informed consent was obtained.  DESCRIPTION OF PROCEDURE:  After an adequate level of anesthesia was achieved, the patient was positioned in the modified beach-chair position.  The right shoulder was correctly identified.  On exam under anesthesia, full passive range of motion was noted with no undue stiffness, no instability.  After a sterile prep and drape of the shoulder and arm, time-out was called.  We entered the shoulder in standard portals  including anterior, posterior, and lateral portals.  We identified significant synovitis throughout the shoulder joint inflammation.  Fortunately, the superior labral biceps complex was intact.  Biceps retracted and the joint visualized and noted to be intact.  No split tears or anything in the biceps.  Anteriorly, the labrum did have fraying and slight tearing.  We debrided that using suction shaver, anterior inferior though down the 5 o'clock position. There were no tearing inferiorly, posteriorly, and up around the posterior superior portion of the labrum, it was completely intact. Articular cartilage was normal.  The rotator cuff appeared to be with a near full-thickness tear of the supraspinatus tendon.  Subscapularis was intact.  Rolled edge normal.  Infraspinatus and teres minor were normal as visualized from the anterior portal.  We then placed a scope in subacromial space.  A thorough bursectomy and acromioplasty was performed creating a type 1 acromial shape with a butcher block technique utilizing a high-speed bur.  A thorough decompression of rotator cuff was achieved.  The rotator cuff was then inspected and noted to be normal from the bursal surface.  We concluded the arthroscopic portion of the procedure, then made a small mini-open incision, started at the anterolateral border of the acromion staying distally about 4 cm and the raphe between the anterolateral heads of the deltoid.  We identified the deltoid raphe.  After making our skin  incision with a 10 blade, we dissected down through that raphe, placed our Arthrex retractor, medially identified the bicipital groove and worked lateral to that.  We made a longitudinal incision in line with the rotator cuff fibers in the soft spot which was consistent with where the tear was in the supraspinatus tendon.  Once we had divided those initial fibers, we were immediately into the tendon path and damaged area.  We went and  cleaned out the damaged tendon with rongeur, prepared the greater tuberosity with a rongeur and curette and then went ahead and did a combination of side-to-side and suture anchor repair, did #2 Hi-Fi side-to-side due to more medial portion of that tear and then out laterally, we did a Y-Knot RC anchor at the articular margin of the greater tuberosity, that was double-ended with #2 Hi-Fi, we did 2 mattress sutures with that and then also did sutures through drill holes out laterally.  Both the anterior leaflet and posterior leaflet of the rotator cuff were repaired directly through transosseous tunnels.  We had nice solid repair, not under any tension.  We ranged the shoulder, no impingement noted, thoroughly irrigated and repaired deltoid anatomically with 0 Vicryl suture, followed by 2-0 Vicryl for subcutaneous closure, and 4-0 Monocryl for skin and portals.  Steri- Strips applied followed by a sterile dressing.  The patient tolerated the surgery well.     Almedia Balls. Ranell Patrick, M.D.     SRN/MEDQ  D:  10/19/2014  T:  10/19/2014  Job:  [id number]

## 2014-10-21 ENCOUNTER — Encounter (HOSPITAL_COMMUNITY): Payer: Self-pay | Admitting: Orthopedic Surgery

## 2014-10-22 NOTE — Anesthesia Postprocedure Evaluation (Signed)
  Anesthesia Post-op Note  Patient: Rodney Clarke  Procedure(s) Performed: Procedure(s): RIGHT SHOULDER ARTHROSCOPY WITH SUBACROMIAL DECOMPRESSION AND MINI OPEN ROTATOR CUFF REPAIR, BICEP TENDONODESIS  (Right)  Patient Location: PACU  Anesthesia Type:GA combined with regional for post-op pain  Level of Consciousness: awake, alert  and oriented  Airway and Oxygen Therapy: Patient Spontanous Breathing  Post-op Pain: none  Post-op Assessment: Post-op Vital signs reviewed              Post-op Vital Signs: Reviewed  Last Vitals:  Filed Vitals:   10/19/14 1449  BP: 142/87  Pulse: 98  Temp:   Resp: 16    Complications: No apparent anesthesia complications
